# Patient Record
Sex: Female | Born: 1970 | Race: White | Hispanic: No | Marital: Single | Smoking: Current every day smoker
Health system: Southern US, Community
[De-identification: ages and names within clinical notes are randomized; demographics above are authoritative.]

## PROBLEM LIST (undated history)

## (undated) DIAGNOSIS — T7840XA Allergy, unspecified, initial encounter: Secondary | ICD-10-CM

## (undated) DIAGNOSIS — I2510 Atherosclerotic heart disease of native coronary artery without angina pectoris: Secondary | ICD-10-CM

## (undated) DIAGNOSIS — M1990 Unspecified osteoarthritis, unspecified site: Secondary | ICD-10-CM

## (undated) DIAGNOSIS — R002 Palpitations: Secondary | ICD-10-CM

## (undated) DIAGNOSIS — B009 Herpesviral infection, unspecified: Secondary | ICD-10-CM

## (undated) DIAGNOSIS — F419 Anxiety disorder, unspecified: Secondary | ICD-10-CM

## (undated) DIAGNOSIS — K219 Gastro-esophageal reflux disease without esophagitis: Secondary | ICD-10-CM

## (undated) DIAGNOSIS — E785 Hyperlipidemia, unspecified: Secondary | ICD-10-CM

## (undated) HISTORY — DX: Herpesviral infection, unspecified: B00.9

## (undated) HISTORY — DX: Anxiety disorder, unspecified: F41.9

## (undated) HISTORY — DX: Unspecified osteoarthritis, unspecified site: M19.90

## (undated) HISTORY — DX: Palpitations: R00.2

## (undated) HISTORY — DX: Gastro-esophageal reflux disease without esophagitis: K21.9

## (undated) HISTORY — DX: Hyperlipidemia, unspecified: E78.5

## (undated) HISTORY — DX: Atherosclerotic heart disease of native coronary artery without angina pectoris: I25.10

## (undated) HISTORY — DX: Allergy, unspecified, initial encounter: T78.40XA

## (undated) HISTORY — PX: OVARIAN CYST SURGERY: SHX726

---

## 1999-07-21 ENCOUNTER — Other Ambulatory Visit: Admission: RE | Admit: 1999-07-21 | Discharge: 1999-07-21 | Payer: Self-pay | Admitting: Obstetrics and Gynecology

## 2001-10-23 ENCOUNTER — Other Ambulatory Visit: Admission: RE | Admit: 2001-10-23 | Discharge: 2001-10-23 | Payer: Self-pay | Admitting: Obstetrics and Gynecology

## 2002-12-14 ENCOUNTER — Other Ambulatory Visit: Admission: RE | Admit: 2002-12-14 | Discharge: 2002-12-14 | Payer: Self-pay | Admitting: Obstetrics and Gynecology

## 2003-08-27 ENCOUNTER — Other Ambulatory Visit: Admission: RE | Admit: 2003-08-27 | Discharge: 2003-08-27 | Payer: Self-pay | Admitting: Obstetrics and Gynecology

## 2004-02-16 ENCOUNTER — Other Ambulatory Visit: Admission: RE | Admit: 2004-02-16 | Discharge: 2004-02-16 | Payer: Self-pay | Admitting: Obstetrics and Gynecology

## 2004-08-10 ENCOUNTER — Other Ambulatory Visit: Admission: RE | Admit: 2004-08-10 | Discharge: 2004-08-10 | Payer: Self-pay | Admitting: Obstetrics and Gynecology

## 2005-04-11 ENCOUNTER — Other Ambulatory Visit: Admission: RE | Admit: 2005-04-11 | Discharge: 2005-04-11 | Payer: Self-pay | Admitting: Obstetrics and Gynecology

## 2007-05-30 ENCOUNTER — Emergency Department (HOSPITAL_COMMUNITY): Admission: EM | Admit: 2007-05-30 | Discharge: 2007-05-31 | Payer: Self-pay | Admitting: Emergency Medicine

## 2007-09-23 ENCOUNTER — Encounter: Admission: RE | Admit: 2007-09-23 | Discharge: 2007-09-23 | Payer: Self-pay | Admitting: Obstetrics and Gynecology

## 2008-05-06 ENCOUNTER — Encounter: Admission: RE | Admit: 2008-05-06 | Discharge: 2008-05-06 | Payer: Self-pay | Admitting: Obstetrics and Gynecology

## 2008-06-09 ENCOUNTER — Ambulatory Visit (HOSPITAL_COMMUNITY): Admission: RE | Admit: 2008-06-09 | Discharge: 2008-06-09 | Payer: Self-pay | Admitting: Obstetrics and Gynecology

## 2009-03-28 ENCOUNTER — Emergency Department (HOSPITAL_COMMUNITY): Admission: EM | Admit: 2009-03-28 | Discharge: 2009-03-28 | Payer: Self-pay | Admitting: Emergency Medicine

## 2010-04-02 ENCOUNTER — Inpatient Hospital Stay (INDEPENDENT_AMBULATORY_CARE_PROVIDER_SITE_OTHER)
Admission: RE | Admit: 2010-04-02 | Discharge: 2010-04-02 | Disposition: A | Discharge status: Home / Self Care | Payer: PRIVATE HEALTH INSURANCE | Source: Ambulatory Visit | Attending: Family Medicine | Admitting: Family Medicine

## 2010-04-02 DIAGNOSIS — J019 Acute sinusitis, unspecified: Secondary | ICD-10-CM

## 2010-04-02 DIAGNOSIS — J309 Allergic rhinitis, unspecified: Secondary | ICD-10-CM

## 2012-05-08 ENCOUNTER — Other Ambulatory Visit: Payer: Self-pay | Admitting: Internal Medicine

## 2012-08-19 ENCOUNTER — Ambulatory Visit (INDEPENDENT_AMBULATORY_CARE_PROVIDER_SITE_OTHER): Payer: BC Managed Care – PPO | Admitting: Family Medicine

## 2012-08-19 ENCOUNTER — Ambulatory Visit: Payer: BC Managed Care – PPO

## 2012-08-19 ENCOUNTER — Encounter: Payer: Self-pay | Admitting: Family Medicine

## 2012-08-19 VITALS — BP 104/66 | HR 89 | Temp 99.5°F | Resp 16 | Ht 64.5 in | Wt 120.0 lb

## 2012-08-19 DIAGNOSIS — F39 Unspecified mood [affective] disorder: Secondary | ICD-10-CM

## 2012-08-19 DIAGNOSIS — F17200 Nicotine dependence, unspecified, uncomplicated: Secondary | ICD-10-CM

## 2012-08-19 DIAGNOSIS — F17209 Nicotine dependence, unspecified, with unspecified nicotine-induced disorders: Secondary | ICD-10-CM | POA: Insufficient documentation

## 2012-08-19 DIAGNOSIS — Z720 Tobacco use: Secondary | ICD-10-CM

## 2012-08-19 DIAGNOSIS — Z23 Encounter for immunization: Secondary | ICD-10-CM

## 2012-08-19 DIAGNOSIS — F339 Major depressive disorder, recurrent, unspecified: Secondary | ICD-10-CM | POA: Insufficient documentation

## 2012-08-19 DIAGNOSIS — Z0000 Encounter for general adult medical examination without abnormal findings: Secondary | ICD-10-CM | POA: Insufficient documentation

## 2012-08-19 LAB — LIPID PANEL
Cholesterol: 137 mg/dL (ref 0–200)
HDL: 35 mg/dL — ABNORMAL LOW (ref >39)
Total CHOL/HDL Ratio: 3.9 Ratio
Triglycerides: 98 mg/dL (ref <150)
VLDL: 20 mg/dL (ref 0–40)

## 2012-08-19 LAB — POCT URINALYSIS DIPSTICK
Bilirubin, UA: NEGATIVE
Leukocytes, UA: NEGATIVE
Nitrite, UA: NEGATIVE
Protein, UA: NEGATIVE
Urobilinogen, UA: 0.2
pH, UA: 5.5

## 2012-08-19 LAB — COMPREHENSIVE METABOLIC PANEL
BUN: 11 mg/dL (ref 6–23)
CO2: 24 mEq/L (ref 19–32)
Calcium: 9.3 mg/dL (ref 8.4–10.5)
Creat: 0.79 mg/dL (ref 0.50–1.10)
Glucose, Bld: 82 mg/dL (ref 70–99)
Total Bilirubin: 0.5 mg/dL (ref 0.3–1.2)

## 2012-08-19 NOTE — Patient Instructions (Signed)
Keeping You Healthy  Get These Tests 1. Blood Pressure- Have your blood pressure checked once a year by your health care provider.  Normal blood pressure is 120/80. 2. Weight- Have your body mass index (BMI) calculated to screen for obesity.  BMI is measure of body fat based on height and weight.  You can also calculate your own BMI at www.nhlbisupport.com/bmi/. 3. Cholesterol- Have your cholesterol checked every 5 years starting at age 44 then yearly starting at age 45. 4. Chlamydia, HIV, and other sexually transmitted diseases- Get screened every year until age 25, then within three months of each new sexual provider. 5. Pap Smear- Every 1-3 years; discuss with your health care provider. 6. Mammogram- Every year starting at age 40  Take these medicines  Calcium with Vitamin D-Your body needs 1200 mg of Calcium each day and 800-1000 IU of Vitamin D daily.  Your body can only absorb 500 mg of Calcium at a time so Calcium must be taken in 2 or 3 divided doses throughout the day.  Multivitamin with folic acid- Once daily if it is possible for you to become pregnant.  Get these Immunizations  Gardasil-Series of three doses; prevents HPV related illness such as genital warts and cervical cancer.  Menactra-Single dose; prevents meningitis.  Tetanus shot- Every 10 years.  Flu shot-Every year.  Take these steps 1. Do not smoke-Your healthcare provider can help you quit.  For tips on how to quit go to www.smokefree.gov or call 1-800 QUITNOW. 2. Be physically active- Exercise 5 days a week for at least 30 minutes.  If you are not already physically active, start slow and gradually work up to 30 minutes of moderate physical activity.  Examples of moderate activity include walking briskly, dancing, swimming, bicycling, etc. 3. Breast Cancer- A self breast exam every month is important for early detection of breast cancer.  For more information and instruction on self breast exams, ask your  healthcare provider or www.womenshealth.gov/faq/breast-self-exam.cfm. 4. Eat a healthy diet- Eat a variety of healthy foods such as fruits, vegetables, whole grains, low fat milk, low fat cheeses, yogurt, lean meats, poultry and fish, beans, nuts, tofu, etc.  For more information go to www. Thenutritionsource.org 5. Drink alcohol in moderation- Limit alcohol intake to one drink or less per day. Never drink and drive. 6. Depression- Your emotional health is as important as your physical health.  If you're feeling down or losing interest in things you normally enjoy please talk to your healthcare provider about being screened for depression. 7. Dental visit- Brush and floss your teeth twice daily; visit your dentist twice a year. 8. Eye doctor- Get an eye exam at least every 2 years. 9. Helmet use- Always wear a helmet when riding a bicycle, motorcycle, rollerblading or skateboarding. 10. Safe sex- If you may be exposed to sexually transmitted infections, use a condom. 11. Seat belts- Seat belts can save your live; always wear one. 12. Smoke/Carbon Monoxide detectors- These detectors need to be installed on the appropriate level of your home. Replace batteries at least once a year. 13. Skin cancer- When out in the sun please cover up and use sunscreen 15 SPF or higher. 14. Violence- If anyone is threatening or hurting you, please tell your healthcare provider.       Smoking Cessation Quitting smoking is important to your health and has many advantages. However, it is not always easy to quit since nicotine is a very addictive drug. Often times, people try 3 times   or more before being able to quit. This document explains the best ways for you to prepare to quit smoking. Quitting takes hard work and a lot of effort, but you can do it. ADVANTAGES OF QUITTING SMOKING  You will live longer, feel better, and live better.  Your body will feel the impact of quitting smoking almost immediately.  Within  20 minutes, blood pressure decreases. Your pulse returns to its normal level.  After 8 hours, carbon monoxide levels in the blood return to normal. Your oxygen level increases.  After 24 hours, the chance of having a heart attack starts to decrease. Your breath, hair, and body stop smelling like smoke.  After 48 hours, damaged nerve endings begin to recover. Your sense of taste and smell improve.  After 72 hours, the body is virtually free of nicotine. Your bronchial tubes relax and breathing becomes easier.  After 2 to 12 weeks, lungs can hold more air. Exercise becomes easier and circulation improves.  The risk of having a heart attack, stroke, cancer, or lung disease is greatly reduced.  After 1 year, the risk of coronary heart disease is cut in half.  After 5 years, the risk of stroke falls to the same as a nonsmoker.  After 10 years, the risk of lung cancer is cut in half and the risk of other cancers decreases significantly.  After 15 years, the risk of coronary heart disease drops, usually to the level of a nonsmoker.  If you are pregnant, quitting smoking will improve your chances of having a healthy baby.  The people you live with, especially any children, will be healthier.  You will have extra money to spend on things other than cigarettes. QUESTIONS TO THINK ABOUT BEFORE ATTEMPTING TO QUIT You may want to talk about your answers with your caregiver.  Why do you want to quit?  If you tried to quit in the past, what helped and what did not?  What will be the most difficult situations for you after you quit? How will you plan to handle them?  Who can help you through the tough times? Your family? Friends? A caregiver?  What pleasures do you get from smoking? What ways can you still get pleasure if you quit? Here are some questions to ask your caregiver:  How can you help me to be successful at quitting?  What medicine do you think would be best for me and how  should I take it?  What should I do if I need more help?  What is smoking withdrawal like? How can I get information on withdrawal? GET READY  Set a quit date.  Change your environment by getting rid of all cigarettes, ashtrays, matches, and lighters in your home, car, or work. Do not let people smoke in your home.  Review your past attempts to quit. Think about what worked and what did not. GET SUPPORT AND ENCOURAGEMENT You have a better chance of being successful if you have help. You can get support in many ways.  Tell your family, friends, and co-workers that you are going to quit and need their support. Ask them not to smoke around you.  Get individual, group, or telephone counseling and support. Programs are available at local hospitals and health centers. Call your local health department for information about programs in your area.  Spiritual beliefs and practices may help some smokers quit.  Download a "quit meter" on your computer to keep track of quit statistics, such as how   long you have gone without smoking, cigarettes not smoked, and money saved.  Get a self-help book about quitting smoking and staying off of tobacco. LEARN NEW SKILLS AND BEHAVIORS  Distract yourself from urges to smoke. Talk to someone, go for a walk, or occupy your time with a task.  Change your normal routine. Take a different route to work. Drink tea instead of coffee. Eat breakfast in a different place.  Reduce your stress. Take a hot bath, exercise, or read a book.  Plan something enjoyable to do every day. Reward yourself for not smoking.  Explore interactive web-based programs that specialize in helping you quit. GET MEDICINE AND USE IT CORRECTLY Medicines can help you stop smoking and decrease the urge to smoke. Combining medicine with the above behavioral methods and support can greatly increase your chances of successfully quitting smoking.  Nicotine replacement therapy helps deliver  nicotine to your body without the negative effects and risks of smoking. Nicotine replacement therapy includes nicotine gum, lozenges, inhalers, nasal sprays, and skin patches. Some may be available over-the-counter and others require a prescription.  Antidepressant medicine helps people abstain from smoking, but how this works is unknown. This medicine is available by prescription.  Nicotinic receptor partial agonist medicine simulates the effect of nicotine in your brain. This medicine is available by prescription. Ask your caregiver for advice about which medicines to use and how to use them based on your health history. Your caregiver will tell you what side effects to look out for if you choose to be on a medicine or therapy. Carefully read the information on the package. Do not use any other product containing nicotine while using a nicotine replacement product.  RELAPSE OR DIFFICULT SITUATIONS Most relapses occur within the first 3 months after quitting. Do not be discouraged if you start smoking again. Remember, most people try several times before finally quitting. You may have symptoms of withdrawal because your body is used to nicotine. You may crave cigarettes, be irritable, feel very hungry, cough often, get headaches, or have difficulty concentrating. The withdrawal symptoms are only temporary. They are strongest when you first quit, but they will go away within 10 14 days. To reduce the chances of relapse, try to:  Avoid drinking alcohol. Drinking lowers your chances of successfully quitting.  Reduce the amount of caffeine you consume. Once you quit smoking, the amount of caffeine in your body increases and can give you symptoms, such as a rapid heartbeat, sweating, and anxiety.  Avoid smokers because they can make you want to smoke.  Do not let weight gain distract you. Many smokers will gain weight when they quit, usually less than 10 pounds. Eat a healthy diet and stay active. You  can always lose the weight gained after you quit.  Find ways to improve your mood other than smoking. FOR MORE INFORMATION  www.smokefree.gov  Document Released: 01/16/2001 Document Revised: 07/24/2011 Document Reviewed: 05/03/2011 [employer] Patient Information 2014 [employer].  

## 2012-08-19 NOTE — Progress Notes (Signed)
Subjective:    Patient ID: Kelly Vincent, female    DOB: 07-05-1970, 42 y.o.   MRN: [id number]  HPI  This 42 y.o. Cauc female is here to establish care and have CPE (PAP w/ GYN). She has hx  of mood disorder treated w/ Depakote years ago; that medication was not well tolerated (too  sedating for pt). She has recently started Wellbutrin for smoking cessation. Other psychosocial  issues revolve around 68 y.o. Autistic son who is off all medications and living in pt's home by   himself. Son's father lives down the street from son; pt reports that she can return to the home  because of son's anger issues. She may have to pursue legal remedies to get him back into  treatment and on medications.  PMHx snd Soc Hx reviewed.  HCM:  [facility]- 2-3 years ago.            PAP/MMG per GYN.            IMM- needs Tdap.   Review of Systems  Constitutional: Negative.   HENT: Positive for neck stiffness.   Eyes: Negative.   Respiratory: Negative.   Cardiovascular: Negative.   Gastrointestinal: Negative.   Endocrine: Negative.   Genitourinary: Negative.   Musculoskeletal: Positive for arthralgias.       Right hip  Skin: Negative.   Allergic/Immunologic: Negative.   Neurological: Negative.   Hematological: Negative.   Psychiatric/Behavioral: Positive for confusion and dysphoric mood.       Confusion - per patient on new meds        Objective:   Physical Exam  Nursing note and vitals reviewed. Constitutional: She is oriented to person, place, and time. Vital signs are normal. She appears well-developed and well-nourished. No distress.  HENT:  Head: Normocephalic and atraumatic.  Right Ear: Hearing, tympanic membrane, external ear and ear canal normal.  Left Ear: Hearing, tympanic membrane, external ear and ear canal normal.  Nose: Nose normal. No nasal deformity or septal deviation.  Mouth/Throat: Uvula is midline, oropharynx is clear and moist and mucous membranes are normal. No oral  lesions. Normal dentition.  Eyes: Conjunctivae, EOM and lids are normal. Pupils are equal, round, and reactive to light. No scleral icterus.  Fundoscopic exam:      The right eye shows no arteriolar narrowing, no AV nicking and no papilledema. The right eye shows red reflex.       The left eye shows no arteriolar narrowing, no AV nicking and no papilledema. The left eye shows red reflex.  Neck: Normal range of motion. Neck supple. No thyromegaly present.  Cardiovascular: Normal rate, regular rhythm, S1 normal, S2 normal, normal heart sounds and normal pulses.  PMI is not displaced.  Exam reveals no gallop and no friction rub.   No murmur heard. Pulmonary/Chest: Effort normal and breath sounds normal. No respiratory distress. She has no wheezes.  Abdominal: Soft. Normal appearance and bowel sounds are normal. She exhibits no distension, no pulsatile midline mass and no mass. There is no hepatomegaly. There is no tenderness. There is no guarding and no CVA tenderness. A hernia is present.  Umbilicus- small barely perceptible hernia; nontender.  Genitourinary:  Deferred.  Musculoskeletal: Normal range of motion. She exhibits no edema and no tenderness.  Lymphadenopathy:    She has no cervical adenopathy.  Neurological: She is alert and oriented to person, place, and time. She has normal strength. She displays no atrophy. No cranial nerve deficit or sensory deficit.  She exhibits normal muscle tone. Coordination and gait normal.  Reflex Scores:      Bicep reflexes are 3+ on the right side and 3+ on the left side.      Patellar reflexes are 3+ on the right side and 3+ on the left side. Skin: Skin is warm, dry and intact. No rash noted. No erythema. No pallor. Nails show no clubbing.  Pt has multiple hyperpigmented and flesh-colored lesions mainly on torso.  Psychiatric: Judgment and thought content normal. Her mood appears anxious. Her affect is not labile and not inappropriate. Her speech is rapid  and/or pressured and tangential. Her speech is not slurred. She is hyperactive. She is not agitated. Cognition and memory are normal. She does not exhibit a depressed mood. She is attentive.    Results for orders placed in visit on 08/19/12  POCT URINALYSIS DIPSTICK      Result Value Range   Color, UA yellow     Clarity, UA clear     Glucose, UA neg     Bilirubin, UA neg     Ketones, UA neg     Spec Grav, UA 1.025     Blood, UA neg     pH, UA 5.5     Protein, UA neg     Urobilinogen, UA 0.2     Nitrite, UA neg     Leukocytes, UA Negative      We attempted to do chest xray (at pt's request) but equipment malfunctioned.     Assessment & Plan:  Routine general medical examination at a health care facility - Plan: POCT urinalysis dipstick, T4, free, TSH, Vitamin D 25 hydroxy, Comprehensive metabolic panel, Lipid panel, CBC with Differential  Unspecified episodic mood disorder - Pt on Bupropion less than 1 month; she will follow-up with the prescriber of that medication.Plan: T4, free, TSH  Tobacco user - Plan: Pick a QUIT DATE; continue Bupropion (this med was prescribed for smoking cessation).  Pt will return in 2 weeks for CXR.  Need for prophylactic vaccination with combined diphtheria-tetanus-pertussis (DTP) vaccine - Plan: Tdap vaccine greater than or equal to 7yo IM

## 2012-08-20 LAB — CBC WITH DIFFERENTIAL/PLATELET
Basophils Relative: 0 % (ref 0–1)
Eosinophils Absolute: 0.1 10*3/uL (ref 0.0–0.7)
Eosinophils Relative: 1 % (ref 0–5)
MCH: 31.9 pg (ref 26.0–34.0)
MCHC: 34.1 g/dL (ref 30.0–36.0)
MCV: 93.4 fL (ref 78.0–100.0)
Monocytes Relative: 5 % (ref 3–12)
Neutrophils Relative %: 67 % (ref 43–77)
Platelets: 370 10*3/uL (ref 150–400)

## 2012-08-20 LAB — VITAMIN D 25 HYDROXY (VIT D DEFICIENCY, FRACTURES): Vit D, 25-Hydroxy: 39 ng/mL (ref 30–89)

## 2012-08-20 NOTE — Progress Notes (Signed)
Quick Note:  Please contact pt and advise that the following labs are abnormal... Vitamin D level is just barely normal; get an over-the-counter Vitamin D supplement 2000 IU and take 1 tablet or capsule daily. You also need to try to get 10-15 minutes of sun exposure most days of the week to help your body use the Vitamin D. This could help your mood and overall health.  Thyroid tests, sodium, potassium, blood sugar, calcium and kidney and liver tests are all normal.  Complete blood counts show an elevated white cell count (could be stress related or due to chronic sinus or respiratory tract problems) but no other abnormalities. The different white cells are present in the normal proportions.  Your lipid panel looks good except HDL ("good") cholesterol is to low. A healthier diet with fruits, vegetables, lean meats and "good" fats as well as regular exercise will increase the HDL cholesterol.  Copy to pt. ______

## 2012-09-03 ENCOUNTER — Encounter: Payer: Self-pay | Admitting: Family Medicine

## 2012-09-03 ENCOUNTER — Ambulatory Visit: Payer: BC Managed Care – PPO

## 2012-09-03 ENCOUNTER — Encounter: Payer: BC Managed Care – PPO | Admitting: Family Medicine

## 2012-09-03 ENCOUNTER — Ambulatory Visit (INDEPENDENT_AMBULATORY_CARE_PROVIDER_SITE_OTHER): Payer: BC Managed Care – PPO | Admitting: Family Medicine

## 2012-09-03 DIAGNOSIS — F17200 Nicotine dependence, unspecified, uncomplicated: Secondary | ICD-10-CM

## 2012-09-03 NOTE — Progress Notes (Signed)
This 42 y.o. [place] female returns today only for CXR; equipment malfunction prevented xray from being performed at previous visit. Pt is a heavy smoker and had concerns about lung health. She was advised to decrease tobacco use and was scheduled to return for xray.  O: [facility] reading (PRIMARY) by  Dr. Audria Nine- CXR reveals no active lung disease; cardiac silhouette is normal and there are no areas of consolidation or lesions. Thoracic spine has mild kyphosis, vertebral spurring w/ normal disc height.  A/P: Tobacco user- smoking cessation advised.

## 2012-09-03 NOTE — Progress Notes (Signed)
Quick Note:  Your recent imaging study (chest xray) is normal. ______ 

## 2012-09-19 ENCOUNTER — Encounter: Payer: Self-pay | Admitting: Family Medicine

## 2012-11-27 ENCOUNTER — Other Ambulatory Visit (HOSPITAL_COMMUNITY): Payer: Self-pay | Admitting: Obstetrics and Gynecology

## 2012-11-27 DIAGNOSIS — N971 Female infertility of tubal origin: Secondary | ICD-10-CM

## 2012-11-27 DIAGNOSIS — Z308 Encounter for other contraceptive management: Secondary | ICD-10-CM

## 2012-12-11 ENCOUNTER — Other Ambulatory Visit: Payer: Self-pay

## 2013-02-06 ENCOUNTER — Ambulatory Visit (HOSPITAL_COMMUNITY)
Admission: RE | Admit: 2013-02-06 | Discharge: 2013-02-06 | Disposition: A | Discharge status: Home / Self Care | Payer: BC Managed Care – PPO | Source: Ambulatory Visit | Attending: Obstetrics and Gynecology | Admitting: Obstetrics and Gynecology

## 2013-02-06 DIAGNOSIS — Z308 Encounter for other contraceptive management: Secondary | ICD-10-CM

## 2013-02-06 DIAGNOSIS — N971 Female infertility of tubal origin: Secondary | ICD-10-CM

## 2013-02-06 DIAGNOSIS — Z3049 Encounter for surveillance of other contraceptives: Secondary | ICD-10-CM | POA: Insufficient documentation

## 2013-02-06 MED ORDER — IOHEXOL 300 MG/ML  SOLN
20.0000 mL | Freq: Once | INTRAMUSCULAR | Status: AC | PRN
  Administered 2013-02-06: 20 mL

## 2013-03-03 ENCOUNTER — Other Ambulatory Visit: Payer: Self-pay | Admitting: Obstetrics and Gynecology

## 2013-03-03 DIAGNOSIS — N63 Unspecified lump in unspecified breast: Secondary | ICD-10-CM

## 2013-03-12 ENCOUNTER — Other Ambulatory Visit: Payer: PRIVATE HEALTH INSURANCE

## 2013-03-13 ENCOUNTER — Ambulatory Visit
Admission: RE | Admit: 2013-03-13 | Discharge: 2013-03-13 | Disposition: A | Discharge status: Home / Self Care | Payer: BC Managed Care – PPO | Source: Ambulatory Visit | Attending: Obstetrics and Gynecology | Admitting: Obstetrics and Gynecology

## 2013-03-13 ENCOUNTER — Other Ambulatory Visit: Payer: Self-pay | Admitting: Obstetrics and Gynecology

## 2013-03-13 DIAGNOSIS — N63 Unspecified lump in unspecified breast: Secondary | ICD-10-CM

## 2013-03-23 ENCOUNTER — Other Ambulatory Visit: Payer: BC Managed Care – PPO

## 2013-03-27 ENCOUNTER — Other Ambulatory Visit: Payer: Self-pay | Admitting: Obstetrics and Gynecology

## 2013-03-27 ENCOUNTER — Ambulatory Visit
Admission: RE | Admit: 2013-03-27 | Discharge: 2013-03-27 | Disposition: A | Discharge status: Home / Self Care | Payer: BC Managed Care – PPO | Source: Ambulatory Visit | Attending: Obstetrics and Gynecology | Admitting: Obstetrics and Gynecology

## 2013-03-27 DIAGNOSIS — N63 Unspecified lump in unspecified breast: Secondary | ICD-10-CM

## 2013-07-22 ENCOUNTER — Other Ambulatory Visit: Payer: Self-pay | Admitting: Obstetrics and Gynecology

## 2013-07-22 DIAGNOSIS — N6320 Unspecified lump in the left breast, unspecified quadrant: Secondary | ICD-10-CM

## 2013-08-06 ENCOUNTER — Encounter: Payer: Self-pay | Admitting: Family Medicine

## 2013-08-06 ENCOUNTER — Ambulatory Visit (INDEPENDENT_AMBULATORY_CARE_PROVIDER_SITE_OTHER): Payer: BC Managed Care – PPO | Admitting: Family Medicine

## 2013-08-06 ENCOUNTER — Ambulatory Visit (INDEPENDENT_AMBULATORY_CARE_PROVIDER_SITE_OTHER): Payer: BC Managed Care – PPO

## 2013-08-06 VITALS — BP 90/60 | HR 75 | Temp 98.5°F | Resp 16 | Ht 65.0 in | Wt 120.6 lb

## 2013-08-06 DIAGNOSIS — F411 Generalized anxiety disorder: Secondary | ICD-10-CM

## 2013-08-06 DIAGNOSIS — F17200 Nicotine dependence, unspecified, uncomplicated: Secondary | ICD-10-CM

## 2013-08-06 MED ORDER — ESCITALOPRAM OXALATE 10 MG PO TABS: ORAL_TABLET | ORAL | Status: DC

## 2013-08-06 NOTE — Progress Notes (Signed)
Subjective:    Patient ID: Kelly Vincent, female    DOB: 03-28-1970, 43 y.o.   MRN: [id number]  HPI  Tis 43 y.o. Cauc female is here for follow-up re: chronic nicotine dependence. She started smoking at age 89 and has tried quitting multiple times. Strategies currently- not smoking in home and delaying cigarettes. She tried Wellbutrin and had crying spells daily after every dose. She is hesitant to try transdermal patch; her husband had side effects related to nicotine overdose (smoking while wearing the patch). Mood disorder has been treated w/ Depakote in remote past; pt was intolerant of this medication. She admits smoking is related to anxiety and family stress. Her son is autistic and now living on his own. She recently sold her home in [place] and has moved to [place]. She plans to vacation this month and would like to try a different medication for anxiety. She reports extreme sensitivity to medication so will want to use only the lowest dose of any med prescribed.  Patient Active Problem List   Diagnosis Date Noted  . Health care maintenance 08/19/2012  . Tobacco user 08/19/2012  . Unspecified episodic mood disorder 08/19/2012   PMHx, Surg Hx, Soc and Fam Hx reviewed.  MEDICATIONS reconciled.   Review of Systems  Constitutional: Negative for diaphoresis, activity change, appetite change, fatigue and unexpected weight change.  HENT: Negative.   Eyes: Negative.   Respiratory: Negative for cough, chest tightness, shortness of breath and wheezing.   Cardiovascular: Negative.   Gastrointestinal: Negative.   Endocrine: Negative.   Musculoskeletal: Negative.   Skin: Negative.   Neurological: Negative.   Psychiatric/Behavioral: Positive for dysphoric mood. Negative for suicidal ideas, behavioral problems, confusion, sleep disturbance, self-injury and agitation. The patient is nervous/anxious.       Objective:   Physical Exam  Nursing note and vitals  reviewed. Constitutional: She is oriented to person, place, and time. She appears well-developed and well-nourished. No distress.  HENT:  Head: Normocephalic and atraumatic.  Right Ear: External ear normal.  Left Ear: External ear normal.  Nose: Nose normal.  Mouth/Throat: Oropharynx is clear and moist.  Eyes: Conjunctivae and EOM are normal. Pupils are equal, round, and reactive to light. No scleral icterus.  Neck: Normal range of motion. Neck supple. No tracheal deviation present. No thyromegaly present.  Cardiovascular: Normal rate, regular rhythm and normal heart sounds.  Exam reveals no gallop and no friction rub.   No murmur heard. Pulmonary/Chest: Effort normal and breath sounds normal. No respiratory distress. She has no wheezes.  Musculoskeletal: Normal range of motion. She exhibits no edema and no tenderness.  Lymphadenopathy:    She has no cervical adenopathy.  Neurological: She is alert and oriented to person, place, and time. No cranial nerve deficit. She exhibits normal muscle tone. Coordination normal.  Skin: Skin is warm and dry. She is not diaphoretic. No erythema. No pallor.  Psychiatric: Her speech is normal and behavior is normal. Judgment and thought content normal. Her mood appears anxious. Her affect is not blunt, not labile and not inappropriate. Cognition and memory are normal. She does not exhibit a depressed mood.    [facility] reading (PRIMARY) by  Dr. Leward Quan: CXR- Lungs are clear w/o lesions or masses; hyperinflation. Heart size normal. 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     Assessment & Plan:  Tobacco use disorder - Encouraged current strategy. May try OTC Transdermal patch- try lowest dose patch and follow package directions. Plan: DG Chest 2  View  Anxiety state, unspecified- Trial: Lexapro  10 mg  1/2 tablet daily for at least 2 weeks; if not feeling different and having no adverse effects, then increase dose to 1 tablet daily. If feels  less anxious and thinks low dose= 5mg  is effective, continue this dose until f/u.  Meds ordered this encounter  Medications  . escitalopram (LEXAPRO) 10 MG tablet    Sig: Take 1/2- 1 tablet every morning as prescribed.    Dispense:  30 tablet    Refill:  2

## 2013-08-06 NOTE — Patient Instructions (Signed)
You Can Quit Smoking If you are ready to quit smoking or are thinking about it, congratulations! You have chosen to help yourself be healthier and live longer! There are lots of different ways to quit smoking. Nicotine gum, nicotine patches, a nicotine inhaler, or nicotine nasal spray can help with physical craving. Hypnosis, support groups, and medicines help break the habit of smoking. TIPS TO GET OFF AND STAY OFF CIGARETTES  Learn to predict your moods. Do not let a bad situation be your excuse to have a cigarette. Some situations in your life might tempt you to have a cigarette.  Ask friends and co-workers not to smoke around you.  Make your home smoke-free.  Never have "just one" cigarette. It leads to wanting another and another. Remind yourself of your decision to quit.  On a card, make a list of your reasons for not smoking. Read it at least the same number of times a day as you have a cigarette. Tell yourself everyday, "I do not want to smoke. I choose not to smoke."  Ask someone at home or work to help you with your plan to quit smoking.  Have something planned after you eat or have a cup of coffee. Take a walk or get other exercise to perk you up. This will help to keep you from overeating.  Try a relaxation exercise to calm you down and decrease your stress. Remember, you may be tense and nervous the first two weeks after you quit. This will pass.  Find new activities to keep your hands busy. Play with a pen, coin, or rubber band. Doodle or draw things on paper.  Brush your teeth right after eating. This will help cut down the craving for the taste of tobacco after meals. You can try mouthwash too.  Try gum, breath mints, or diet candy to keep something in your mouth. IF YOU SMOKE AND WANT TO QUIT:  Do not stock up on cigarettes. Never buy a carton. Wait until one pack is finished before you buy another.  Never carry cigarettes with you at work or at home.  Keep cigarettes  as far away from you as possible. Leave them with someone else.  Never carry matches or a lighter with you.  Ask yourself, "Do I need this cigarette or is this just a reflex?"  Bet with someone that you can quit. Put cigarette money in a piggy bank every morning. If you smoke, you give up the money. If you do not smoke, by the end of the week, you keep the money.  Keep trying. It takes 21 days to change a habit!  Talk to your doctor about using medicines to help you quit. These include nicotine replacement gum, lozenges, or skin patches. Document Released: 11/18/2008 Document Revised: 04/16/2011 Document Reviewed: 11/18/2008 [facility] Patient Information 2015 [place], [place]. This information is not intended to replace advice given to you by your health care provider. Make sure you discuss any questions you have with your health care provider.    I have prescribed generic LEXAPRO (escitalopram) to help reduce your anxiety and help you quit smoking. Take 1/2 tablet every morning for 2 weeks. If you do not feel any different, then increase dose to 1 tablet every morning. You may want to try a low dose patch along with this medication. The patch is available without a prescription. Follow the package direction and pick a quit date for your last day as a SMOKER.

## 2013-09-10 ENCOUNTER — Encounter: Payer: Self-pay | Admitting: Family Medicine

## 2013-09-10 ENCOUNTER — Ambulatory Visit
Admission: RE | Admit: 2013-09-10 | Discharge: 2013-09-10 | Disposition: A | Discharge status: Home / Self Care | Payer: BC Managed Care – PPO | Source: Ambulatory Visit | Attending: Obstetrics and Gynecology | Admitting: Obstetrics and Gynecology

## 2013-09-10 ENCOUNTER — Ambulatory Visit (INDEPENDENT_AMBULATORY_CARE_PROVIDER_SITE_OTHER): Payer: BC Managed Care – PPO | Admitting: Family Medicine

## 2013-09-10 VITALS — BP 90/60 | HR 74 | Temp 99.2°F | Resp 16 | Ht 64.75 in | Wt 117.6 lb

## 2013-09-10 DIAGNOSIS — N6320 Unspecified lump in the left breast, unspecified quadrant: Secondary | ICD-10-CM

## 2013-09-10 DIAGNOSIS — F39 Unspecified mood [affective] disorder: Secondary | ICD-10-CM

## 2013-09-10 DIAGNOSIS — Z720 Tobacco use: Secondary | ICD-10-CM

## 2013-09-10 DIAGNOSIS — Z0000 Encounter for general adult medical examination without abnormal findings: Secondary | ICD-10-CM

## 2013-09-10 DIAGNOSIS — F17200 Nicotine dependence, unspecified, uncomplicated: Secondary | ICD-10-CM

## 2013-09-10 DIAGNOSIS — E786 Lipoprotein deficiency: Secondary | ICD-10-CM

## 2013-09-10 LAB — CBC WITH DIFFERENTIAL/PLATELET
Basophils Absolute: 0 10*3/uL (ref 0.0–0.1)
Basophils Relative: 0 % (ref 0–1)
Eosinophils Absolute: 0 10*3/uL (ref 0.0–0.7)
Eosinophils Relative: 0 % (ref 0–5)
HEMATOCRIT: 41.1 % (ref 36.0–46.0)
HEMOGLOBIN: 13.7 g/dL (ref 12.0–15.0)
LYMPHS PCT: 31 % (ref 12–46)
Lymphs Abs: 2.6 10*3/uL (ref 0.7–4.0)
MCH: 31.9 pg (ref 26.0–34.0)
MCHC: 33.3 g/dL (ref 30.0–36.0)
MCV: 95.6 fL (ref 78.0–100.0)
MONO ABS: 0.5 10*3/uL (ref 0.1–1.0)
Monocytes Relative: 6 % (ref 3–12)
Neutro Abs: 5.2 10*3/uL (ref 1.7–7.7)
Neutrophils Relative %: 63 % (ref 43–77)
Platelets: 376 10*3/uL (ref 150–400)
RBC: 4.3 MIL/uL (ref 3.87–5.11)
RDW: 13.9 % (ref 11.5–15.5)
WBC: 8.3 10*3/uL (ref 4.0–10.5)

## 2013-09-10 NOTE — Patient Instructions (Addendum)
Keeping You Healthy  Get These Tests 1. Blood Pressure- Have your blood pressure checked once a year by your health care provider.  Normal blood pressure is 120/80. 2. Weight- Have your body mass index (BMI) calculated to screen for obesity.  BMI is measure of body fat based on height and weight.  You can also calculate your own BMI at GravelBags.it. 3. Cholesterol- Have your cholesterol checked every 5 years starting at age 45 then yearly starting at age 81. 74. Chlamydia, HIV, and other sexually transmitted diseases- Get screened every year until age 64, then within three months of each new sexual provider. 5. Pap Smear- Every 1-3 years; discuss with your health care provider. 6. Mammogram- Every year starting at age 72  Take these medicines  Calcium with Vitamin D-Your body needs 1200 mg of Calcium each day and [phone] IU of Vitamin D daily.  Your body can only absorb 500 mg of Calcium at a time so Calcium must be taken in 2 or 3 divided doses throughout the day.  Multivitamin with folic acid- Once daily if it is possible for you to become pregnant.  Get these Immunizations  Gardasil-Series of three doses; prevents HPV related illness such as genital warts and cervical cancer.  Menactra-Single dose; prevents meningitis.  Tetanus shot- Every 10 years.  Flu shot-Every year.  Take these steps 1. Do not smoke-Your healthcare provider can help you quit.  For tips on how to quit go to www.smokefree.gov or call 1-800 QUITNOW. 2. Be physically active- Exercise 5 days a week for at least 30 minutes.  If you are not already physically active, start slow and gradually work up to 30 minutes of moderate physical activity.  Examples of moderate activity include walking briskly, dancing, swimming, bicycling, etc. 3. Breast Cancer- A self breast exam every month is important for early detection of breast cancer.  For more information and instruction on self breast exams, ask your  healthcare provider or https://www.patel.info/. 4. Eat a healthy diet- Eat a variety of healthy foods such as fruits, vegetables, whole grains, low fat milk, low fat cheeses, yogurt, lean meats, poultry and fish, beans, nuts, tofu, etc.  For more information go to www. Thenutritionsource.org 5. Drink alcohol in moderation- Limit alcohol intake to one drink or less per day. Never drink and drive. 6. Depression- Your emotional health is as important as your physical health.  If you're feeling down or losing interest in things you normally enjoy please talk to your healthcare provider about being screened for depression. 7. Dental visit- Brush and floss your teeth twice daily; visit your dentist twice a year. 8. Eye doctor- Get an eye exam at least every 2 years. 9. Helmet use- Always wear a helmet when riding a bicycle, motorcycle, rollerblading or skateboarding. 37. Safe sex- If you may be exposed to sexually transmitted infections, use a condom. 11. Seat belts- Seat belts can save your live; always wear one. 12. Smoke/Carbon Monoxide detectors- These detectors need to be installed on the appropriate level of your home. Replace batteries at least once a year. 13. Skin cancer- When out in the sun please cover up and use sunscreen 15 SPF or higher. 14. Violence- If anyone is threatening or hurting you, please tell your healthcare provider.        Mediterranean Diet  Why follow it? Research shows.   Those who follow the Mediterranean diet have a reduced risk of heart disease    The diet is associated with a reduced  incidence of Parkinson's and Alzheimer's diseases   People following the diet may have longer life expectancies and lower rates of chronic diseases    The Dietary Guidelines for Americans recommends the Mediterranean diet as an eating plan to promote health and prevent disease  What Is the Mediterranean Diet?    Healthy eating plan based on typical foods and  recipes of Mediterranean-style cooking   The diet is primarily a plant based diet; these foods should make up a majority of meals   Starches - Plant based foods should make up a majority of meals - They are an important sources of vitamins, minerals, energy, antioxidants, and fiber - Choose whole grains, foods high in fiber and minimally processed items  - Typical grain sources include wheat, oats, barley, corn, brown rice, bulgar, farro, millet, polenta, couscous  - Various types of beans include chickpeas, lentils, fava beans, black beans, white beans   Fruits  Veggies - Large quantities of antioxidant rich fruits & veggies; 6 or more servings  - Vegetables can be eaten raw or lightly drizzled with oil and cooked  - Vegetables common to the traditional Mediterranean Diet include: artichokes, arugula, beets, broccoli, brussel sprouts, cabbage, carrots, celery, collard greens, cucumbers, eggplant, kale, leeks, lemons, lettuce, mushrooms, okra, onions, peas, peppers, potatoes, pumpkin, radishes, rutabaga, shallots, spinach, sweet potatoes, turnips, zucchini - Fruits common to the Mediterranean Diet include: apples, apricots, avocados, cherries, clementines, dates, figs, grapefruits, grapes, melons, nectarines, oranges, peaches, pears, pomegranates, strawberries, tangerines  Fats - Replace butter and margarine with healthy oils, such as olive oil, canola oil, and tahini  - Limit nuts to no more than a handful a day  - Nuts include walnuts, almonds, pecans, pistachios, pine nuts  - Limit or avoid candied, honey roasted or heavily salted nuts - Olives are central to the [employer] - can be eaten whole or used in a variety of dishes   Meats Protein - Limiting red meat: no more than a few times a month - When eating red meat: choose lean cuts and keep the portion to the size of deck of cards - Eggs: approx. 0 to 4 times a week  - Fish and lean poultry: at least 2 a week  - Healthy protein  sources include, chicken, [place], lean beef, lamb - Increase intake of seafood such as tuna, salmon, trout, mackerel, shrimp, scallops - Avoid or limit high fat processed meats such as sausage and bacon  Dairy - Include moderate amounts of low fat dairy products  - Focus on healthy dairy such as fat free yogurt, skim milk, low or reduced fat cheese - Limit dairy products higher in fat such as whole or 2% milk, cheese, ice cream  Alcohol - Moderate amounts of red wine is ok  - No more than 5 oz daily for women (all ages) and men older than age 28  - No more than 10 oz of wine daily for men younger than 39  Other - Limit sweets and other desserts  - Use herbs and spices instead of salt to flavor foods  - Herbs and spices common to the traditional Mediterranean Diet include: basil, bay leaves, chives, cloves, cumin, fennel, garlic, lavender, marjoram, mint, oregano, parsley, pepper, rosemary, sage, savory, sumac, tarragon, thyme   It's not just a diet, it's a lifestyle:    The Mediterranean diet includes lifestyle factors typical of those in the region    Foods, drinks and meals are best eaten with others and savored  Daily physical activity is important for overall good health   This could be strenuous exercise like running and aerobics   This could also be more leisurely activities such as walking, housework, yard-work, or taking the stairs   Moderation is the key; a balanced and healthy diet accommodates most foods and drinks   Consider portion sizes and frequency of consumption of certain foods   Meal Ideas & Options:    Breakfast:  o Whole wheat toast or whole wheat English muffins with peanut butter & hard boiled egg o Steel cut oats topped with apples & cinnamon and skim milk  o Fresh fruit: banana, strawberries, melon, berries, peaches  o Smoothies: strawberries, bananas, greek yogurt, peanut butter o Low fat greek yogurt with blueberries and granola  o Egg white omelet with  spinach and mushrooms o Breakfast couscous: whole wheat couscous, apricots, skim milk, cranberries    Sandwiches:  o Hummus and grilled vegetables (peppers, zucchini, squash) on whole wheat bread   o Grilled chicken on whole wheat pita with lettuce, tomatoes, cucumbers or tzatziki  o Tuna salad on whole wheat bread: tuna salad made with greek yogurt, olives, red peppers, capers, green onions o Garlic rosemary lamb pita: lamb sauted with garlic, rosemary, salt & pepper; add lettuce, cucumber, greek yogurt to pita - flavor with lemon juice and black pepper    Seafood:  o Mediterranean grilled salmon, seasoned with garlic, basil, parsley, lemon juice and black pepper o Shrimp, lemon, and spinach whole-grain pasta salad made with low fat greek yogurt  o Seared scallops with lemon orzo  o Seared tuna steaks seasoned salt, pepper, coriander topped with tomato mixture of olives, tomatoes, olive oil, minced garlic, parsley, green onions and cappers    Meats:  o Herbed greek chicken salad with kalamata olives, cucumber, feta  o Red bell peppers stuffed with spinach, bulgur, lean ground beef (or lentils) & topped with feta   o Kebabs: skewers of chicken, tomatoes, onions, zucchini, squash  o [place] burgers: made with red onions, mint, dill, lemon juice, feta cheese topped with roasted red peppers   Vegetarian o Cucumber salad: cucumbers, artichoke hearts, celery, red onion, feta cheese, tossed in olive oil & lemon juice  o Hummus and whole grain pita points with a greek salad (lettuce, tomato, feta, olives, cucumbers, red onion) o Lentil soup with celery, carrots made with vegetable broth, garlic, salt and pepper  o Tabouli salad: parsley, bulgur, mint, scallions, cucumbers, tomato, radishes, lemon juice, olive oil, salt and pepper. o    LEXAPRO medication- Take this medication at bedtime since it is making you drowsy. Continue to take 1/2 tablet for now. 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 It may take you several days to adjust to the increased dose.  Have your pharmacy contact the clinic when you need medication refill.    I encourage you to stop smoking on your birthday (11/06/2015).  See the tips below for suggestions:    You Can Quit Smoking If you are ready to quit smoking or are thinking about it, congratulations! You have chosen to help yourself be healthier and live longer! There are lots of different ways to quit smoking. Nicotine gum, nicotine patches, a nicotine inhaler, or nicotine nasal spray can help with physical craving. Hypnosis, support groups, and medicines help break the habit of smoking. TIPS TO GET OFF AND STAY OFF CIGARETTES  Learn to  predict your moods. Do not let a bad situation be your excuse to have a cigarette. Some situations in your life might tempt you to have a cigarette.  Ask friends and co-workers not to smoke around you.  Make your home smoke-free.  Never have "just one" cigarette. It leads to wanting another and another. Remind yourself of your decision to quit.  On a card, make a list of your reasons for not smoking. Read it at least the same number of times a day as you have a cigarette. Tell yourself everyday, "I do not want to smoke. I choose not to smoke."  Ask someone at home or work to help you with your plan to quit smoking.  Have something planned after you eat or have a cup of coffee. Take a walk or get other exercise to perk you up. This will help to keep you from overeating.  Try a relaxation exercise to calm you down and decrease your stress. Remember, you may be tense and nervous the first two weeks after you quit. This will pass.  Find new activities to keep your hands busy. Play with a pen, coin, or rubber band. Doodle or draw things on paper.  Brush your teeth right after eating. This will help cut down the craving for the taste of tobacco after meals. You can  try mouthwash too.  Try gum, breath mints, or diet candy to keep something in your mouth. IF YOU SMOKE AND WANT TO QUIT:  Do not stock up on cigarettes. Never buy a carton. Wait until one pack is finished before you buy another.  Never carry cigarettes with you at work or at home.  Keep cigarettes as far away from you as possible. Leave them with someone else.  Never carry matches or a lighter with you.  Ask yourself, "Do I need this cigarette or is this just a reflex?"  Bet with someone that you can quit. Put cigarette money in a piggy bank every morning. If you smoke, you give up the money. If you do not smoke, by the end of the week, you keep the money.  Keep trying. It takes 21 days to change a habit!  Talk to your doctor about using medicines to help you quit. These include nicotine replacement gum, lozenges, or skin patches. Document Released: 11/18/2008 Document Revised: 04/16/2011 Document Reviewed: 11/18/2008 [facility] Patient Information 2015 [place], [place]. This information is not intended to replace advice given to you by your health care provider. Make sure you discuss any questions you have with your health care provider.

## 2013-09-10 NOTE — Progress Notes (Signed)
Subjective:    Patient ID: Kelly Vincent, female    DOB: 06/01/1970, 43 y.o.   MRN: [id number]  HPI  This 43 y.o. Cauc female is here for CPE. Breast/GYN exam performed by Dr. Willis Modena. No abnormalities found. Pt is taking Lexapro (generic) prescribed for mood disorder. Current dose is 1/2 tablet taken every morning; pt c/o drowsiness and midday sleepiness. Medication is effective; she is less talkative, mood is more stable and less irritable. She plans to increase dose to 1 tablet this weekend. Pt is smoking less since starting medication.  HCM: MMG- Current.           PAP- Current as noted above.           IMM- Tdap current.           Dental- Current (3 cavities filled at last visit).           Vision- Needs to schedule.  Patient Active Problem List   Diagnosis Date Noted  . Health care maintenance 08/19/2012  . Tobacco user 08/19/2012  . Unspecified episodic mood disorder 08/19/2012    Outpatient Encounter Prescriptions as of 09/10/2013  Medication Sig Note  . escitalopram (LEXAPRO) 10 MG tablet Take 1/2- 1 tablet every morning as prescribed. 09/10/2013: Need refill    Allergies  Allergen Reactions  . Codeine Nausea And Vomiting  . Sulfa Antibiotics Hives and Rash    Past Surgical History  Procedure Laterality Date  . Ovarian cyst surgery      rupture of cyst    History   Social History  . Marital Status: Single    Spouse Name: N/A    Number of Children: N/A  . Years of Education: N/A   Occupational History  . special education teacher    Social History Main Topics  . Smoking status: Current Every Day Smoker -- 1.00 packs/day for 30 years    Types: Cigarettes  . Smokeless tobacco: Not on file  . Alcohol Use: No     Comment: LIQUOR 1-2 drinks  . Drug Use: No  . Sexual Activity: Not on file   Other Topics Concern  . Not on file   Social History Narrative   Significant other   College education   Works as a Chief Technology Officer   exercises   Pt  is adopted.   Review of Systems  Constitutional: Negative.   HENT: Positive for ear pain.   Eyes: Negative.   Respiratory: Negative.   Cardiovascular: Negative.   Gastrointestinal: Negative.   Endocrine: Negative.   Genitourinary: Negative.   Musculoskeletal: Positive for neck pain.  Skin: Negative.   Allergic/Immunologic: Positive for environmental allergies.  Neurological: Negative.   Hematological: Negative.   Psychiatric/Behavioral: The patient is nervous/anxious.       Objective:   Physical Exam  Nursing note and vitals reviewed. Constitutional: She is oriented to person, place, and time. Vital signs are normal. She appears well-developed and well-nourished. No distress.  HENT:  Head: Normocephalic and atraumatic.  Right Ear: Hearing, external ear and ear canal normal. Tympanic membrane is not erythematous and not retracted. No middle ear effusion.  Left Ear: Hearing, tympanic membrane, external ear and ear canal normal.  Nose: Nose normal. No nasal deformity or septal deviation.  Mouth/Throat: Uvula is midline, oropharynx is clear and moist and mucous membranes are normal. No oral lesions. Normal dentition. No dental caries.  Eyes: Conjunctivae, EOM and lids are normal. Pupils are equal, round, and reactive to light. No  scleral icterus.  Fundoscopic exam:      The right eye shows no arteriolar narrowing, no AV nicking and no papilledema. The right eye shows red reflex.       The left eye shows no arteriolar narrowing, no AV nicking and no papilledema. The left eye shows red reflex.  Neck: Trachea normal, normal range of motion and full passive range of motion without pain. Neck supple. No spinous process tenderness and no muscular tenderness present. No mass and no thyromegaly present.  Cardiovascular: Normal rate, regular rhythm, S1 normal, S2 normal, normal heart sounds, intact distal pulses and normal pulses.   No extrasystoles are present. PMI is not displaced.  Exam  reveals no gallop and no friction rub.   No murmur heard. Pulmonary/Chest: Effort normal and breath sounds normal. No respiratory distress.  Abdominal: Soft. Normal appearance and bowel sounds are normal. She exhibits no distension, no pulsatile midline mass and no mass. There is no hepatosplenomegaly. There is no tenderness. There is no guarding and no CVA tenderness.  Genitourinary:  Deferred.  Musculoskeletal:       Cervical back: Normal.       Thoracic back: Normal.       Lumbar back: Normal.  Remainder of exam unremarkable.  Lymphadenopathy:       Head (right side): No submental, no submandibular, no tonsillar, no preauricular, no posterior auricular and no occipital adenopathy present.       Head (left side): No submental, no submandibular, no tonsillar, no preauricular, no posterior auricular and no occipital adenopathy present.    She has no cervical adenopathy.       Right: No inguinal and no supraclavicular adenopathy present.       Left: No inguinal and no supraclavicular adenopathy present.  Neurological: She is alert and oriented to person, place, and time. She has normal strength. She displays no atrophy and no tremor. No cranial nerve deficit or sensory deficit. She exhibits normal muscle tone. Coordination and gait normal.  Reflex Scores:      Tricep reflexes are 2+ on the right side and 2+ on the left side.      Bicep reflexes are 2+ on the right side and 2+ on the left side.      Brachioradialis reflexes are 2+ on the right side and 2+ on the left side.      Patellar reflexes are 2+ on the right side and 2+ on the left side. Skin: Skin is warm, dry and intact. No ecchymosis, no lesion, no petechiae and no rash noted. She is not diaphoretic. No cyanosis or erythema. No pallor.  Psychiatric: Her behavior is normal. Judgment and thought content normal. Her mood appears anxious. Her affect is not labile and not inappropriate. Her speech is rapid and/or pressured. Her speech is  not tangential. Cognition and memory are normal. She does not exhibit a depressed mood.       Assessment & Plan:  Routine general medical examination at a health care facility - Plan: CBC with Differential  Low HDL (under 40) - Mediterranean Diet.    Plan: Lipid panel  Tobacco user- Encouraged cessation by next birthday (Nov 06, 2015); pt will try to continue to  limit use.  Unspecified episodic mood disorder - Stable on low-dose escitalopram 10 mg - currently taking 1/2 tablet daily. Somnolence side effect >> pt to take medication at bedtime. Stay on 1/2 tablet for now. 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 Plan: Basic metabolic panel

## 2013-09-11 LAB — BASIC METABOLIC PANEL
BUN: 13 mg/dL (ref 6–23)
CALCIUM: 9 mg/dL (ref 8.4–10.5)
CO2: 24 meq/L (ref 19–32)
CREATININE: 0.76 mg/dL (ref 0.50–1.10)
Chloride: 105 mEq/L (ref 96–112)
GLUCOSE: 91 mg/dL (ref 70–99)
Potassium: 4.2 mEq/L (ref 3.5–5.3)
Sodium: 138 mEq/L (ref 135–145)

## 2013-09-11 LAB — LIPID PANEL
Cholesterol: 130 mg/dL (ref 0–200)
HDL: 39 mg/dL — AB (ref >39)
LDL CALC: 79 mg/dL (ref 0–99)
TRIGLYCERIDES: 60 mg/dL (ref <150)
Total CHOL/HDL Ratio: 3.3 Ratio
VLDL: 12 mg/dL (ref 0–40)

## 2014-04-30 ENCOUNTER — Ambulatory Visit: Payer: BC Managed Care – PPO | Admitting: Family Medicine

## 2014-04-30 IMAGING — MG MM DIGITAL DIAGNOSTIC BILAT
5 series · 5 of 5 positions shown · non-contrast
Comparison: Multiple priors

CLINICAL DATA: Patient with left breast lump. She states that she
has recently started Depo-Provera.

EXAM:
DIGITAL DIAGNOSTIC  BILATERAL MAMMOGRAM WITH CAD
ULTRASOUND LEFT BREAST

[L TAN]
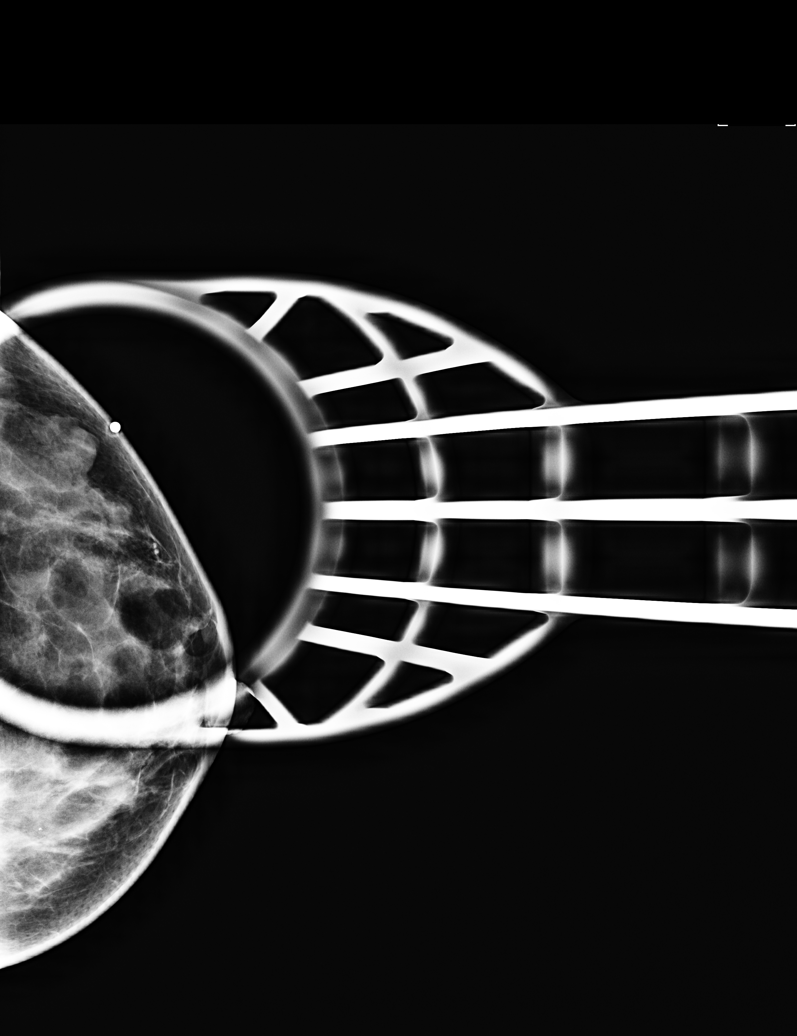

[R CC]
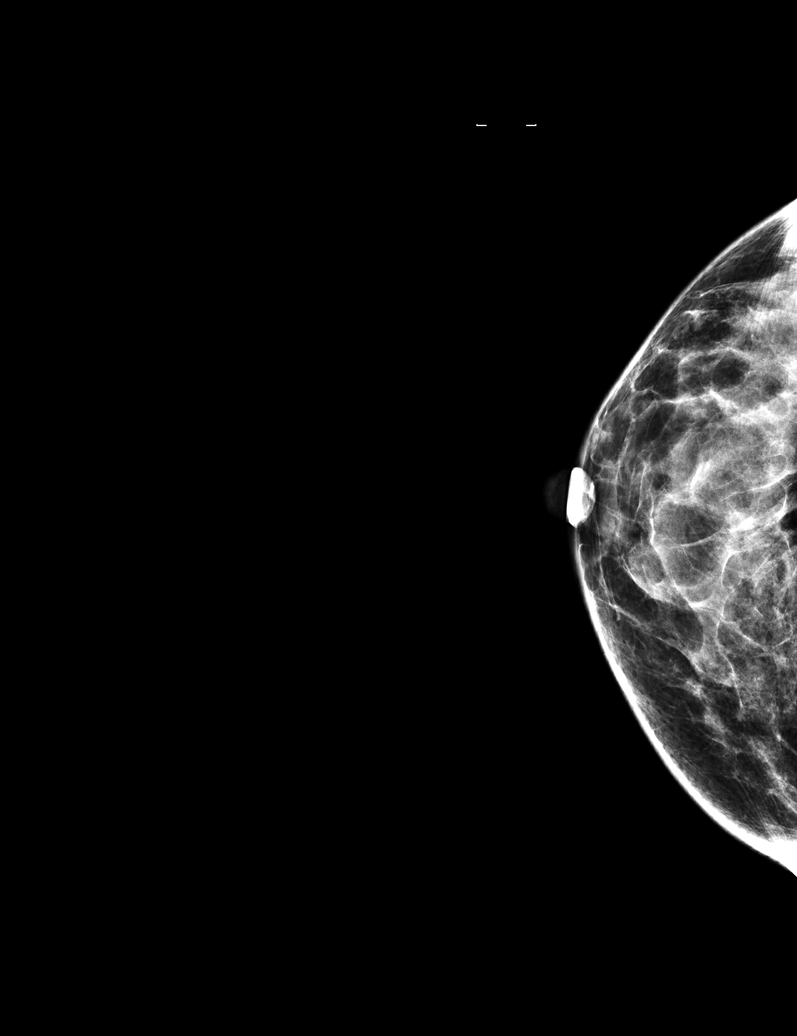

[L MLO]
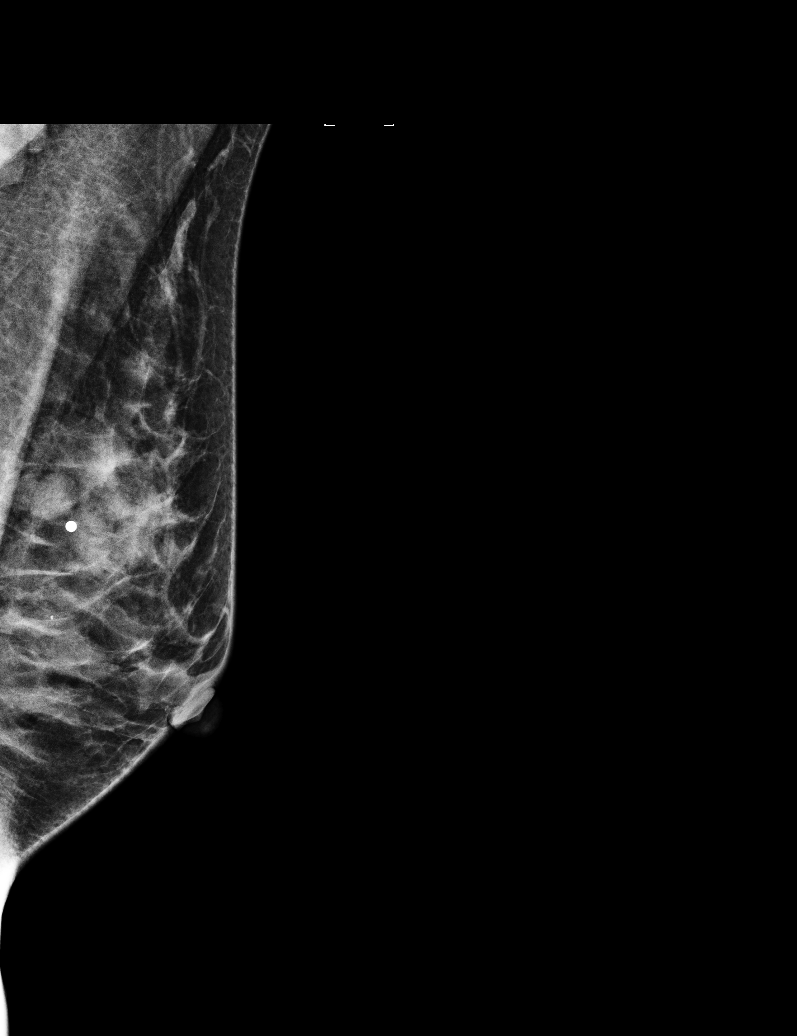

[R MLO]
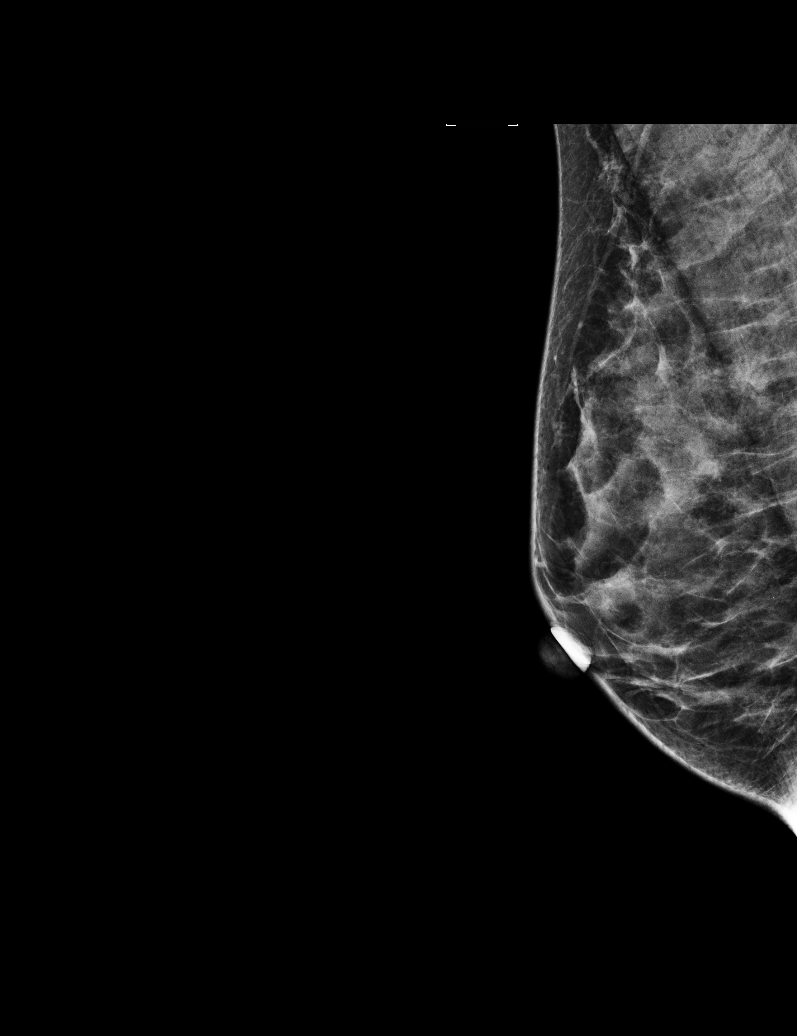

[L CC]
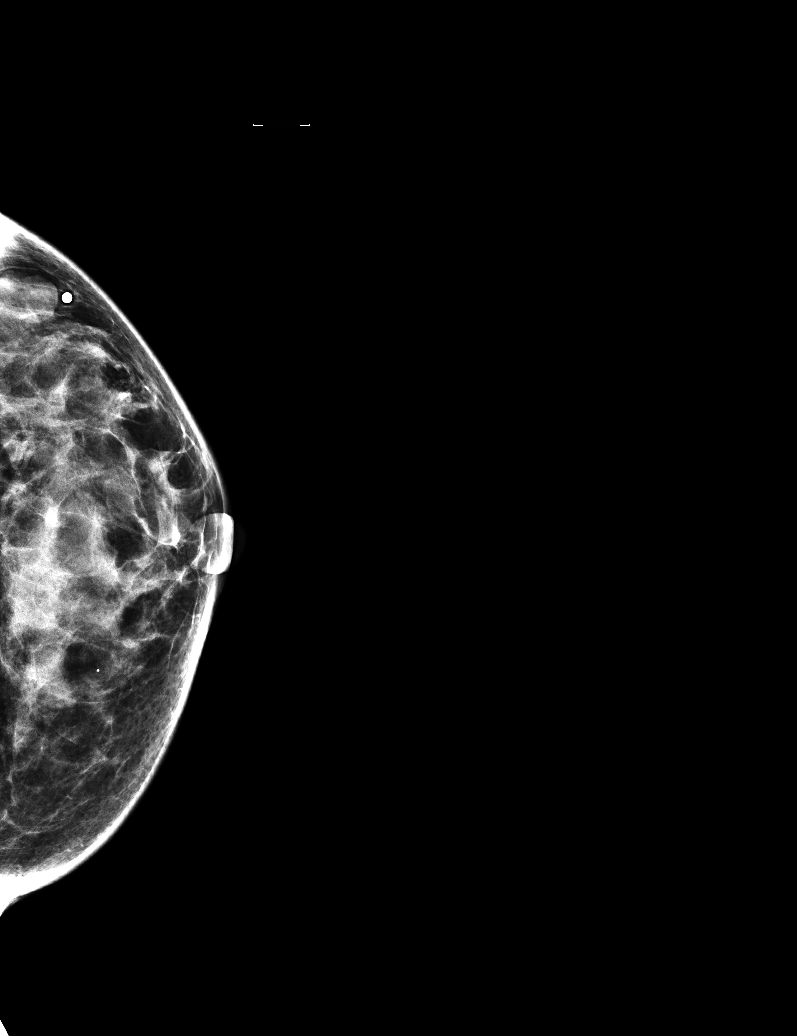

[5 of 5 positions shown; findings below may reference images not displayed]

ACR Breast Density Category c: The breast tissue is heterogeneously
dense, which may obscure small masses.
FINDINGS: In the central outer posterior left breast, there is a partially
obscured isodense mass measuring up to approximately 1 cm. No
associated microcalcifications or architectural distortion.

Mammographic images were processed with CAD.

On physical exam, a mobile mass is palpated at 3 o'clock, 5 cm from
the nipple.

Targeted ultrasound demonstrates a circumscribed hypoechoic mass at
3 o'clock, 5 cm from the nipple. This measures 1.5 x 1 x 0.7 cm. No
internal vascularity is identified. Adjacent to this at 3 o'clock, 3
cm from the nipple, is a circumscribed hypoechoic mass measuring 9 x
5 x 3 mm. This may represent a fibroadenoma. Additionally, there are
multiple subcentimeter cysts at 3 o'clock.
IMPRESSION: 1. Palpable left breast mass at 3 o'clock, 5 cm from the nipple.
2. Circumscribed 0.9 cm left breast mass at 3 o'clock, 3 cm from the
nipple.

RECOMMENDATION:
Ultrasound-guided left breast biopsy is recommended for the palpable
left breast mass at 3 o'clock, 5 cm from the nipple. If benign,
followup of the second mass can be considered. The patient will call
back to make an appointment for biopsy after reviewing her schedule.

I have discussed the findings and recommendations with the patient.
Results were also provided in writing at the conclusion of the
visit.

BI-RADS CATEGORY  4: Suspicious abnormality - biopsy should be
considered.

## 2014-05-03 ENCOUNTER — Encounter: Payer: Self-pay | Admitting: Physician Assistant

## 2014-05-03 ENCOUNTER — Ambulatory Visit (INDEPENDENT_AMBULATORY_CARE_PROVIDER_SITE_OTHER): Payer: BC Managed Care – PPO | Admitting: Physician Assistant

## 2014-05-03 VITALS — BP 99/64 | HR 87 | Temp 98.1°F | Resp 16 | Ht 64.75 in | Wt 120.2 lb

## 2014-05-03 DIAGNOSIS — Z720 Tobacco use: Secondary | ICD-10-CM

## 2014-05-03 DIAGNOSIS — F39 Unspecified mood [affective] disorder: Secondary | ICD-10-CM

## 2014-05-03 DIAGNOSIS — F17200 Nicotine dependence, unspecified, uncomplicated: Secondary | ICD-10-CM

## 2014-05-03 NOTE — Progress Notes (Signed)
   Subjective:    Patient ID: Kelly Vincent, female    DOB: 04/11/1970, 44 y.o.   MRN: [id number]  Chief Complaint  Patient presents with  . Medication Refill    stopped taking lexapro   Patient Active Problem List   Diagnosis Date Noted  . Tobacco user 08/19/2012  . Unspecified episodic mood disorder 08/19/2012   Prior to Admission medications   Medication Sig Start Date End Date Taking? Authorizing Provider  cetirizine (ZYRTEC) 10 MG tablet Take 10 mg by mouth daily.   Yes Historical Provider, MD  escitalopram (LEXAPRO) 10 MG tablet Take 1/2- 1 tablet every morning as prescribed. Patient not taking: Reported on 05/03/2014 08/06/13   Barton Fanny, MD   Medications, allergies, past medical history, surgical history, family history, social history and problem list reviewed and updated.  HPI  11 yof with pmh episodic mood disorder presents to discuss medication.   Hx: Has occasional anxiety/stress. Has autistic son who was causing her lot of stress for several yrs. Has been tried on depakote and wellbutrin in the past. Was first seen by [name] 7/15, started on lexapro 5 mg qd. Seen one month later 8/15, change to night time dosing due to somnolence.   Today pt states she stopped taking the lexapro 4 months ago. States that she has never had persistent anxiety or depression. More so episodic episodes where she gets very anxious or stressed with certain things in her life. She feels these episodes have decreased since her son moved into his own apt. High functioning autism. Sees him approx once week.   Denies anxeity most days of week, denies it affecting day to day life. Stopped lexapro as she didn't think needed it anymore and was also having decreased libido with med.   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   Not interested in benzos for episodic mood disorder. States she knows she has addictive nature.   Continues to smoke. Trying to quit cold [place]. Has tried chantix and  wellbutrin in past. Not interested in NRT.   Denies SI/HI.   Review of Systems See HPI.     Objective:   Physical Exam  Constitutional: She is oriented to person, place, and time. She appears well-developed and well-nourished.  Non-toxic appearance. She does not have a sickly appearance. She does not appear ill. No distress.  BP 99/64 mmHg  Pulse 87  Temp(Src) 98.1 F (36.7 C) (Oral)  Resp 16  Ht 5' 4.75" (1.645 m)  Wt 120 lb 3.2 oz (54.522 kg)  BMI 20.15 kg/m2  SpO2 99%  LMP 04/30/2014   Neurological: She is alert and oriented to person, place, and time.  Psychiatric: She has a normal mood and affect. Her speech is normal and behavior is normal.  2/6 positives on anxiety screen      Assessment & Plan:   19 yof with pmh episodic mood disorder presents to discuss medication.  Episodic mood disorder --pt tapered off lexapro on her own --feels happy where she currently is, with son taken care of, work going well, engaged to [employer], yoga weekly --not interested in any medication at this time --rtc 6 months for cpe  Smoker --not interested in medication to help with cessation, planning to stop cold [place] --encouraged  Julieta Gutting, PA-C Physician Assistant-Certified [facility]  05/03/2014 3:22 PM

## 2014-05-03 NOTE — Patient Instructions (Signed)
Thank you for coming in today. I think you've made the right choice with stopping the Lexapro.  Please let us know if we can help you at all with stopping smoking. Your lungs sounded good today, and your vital signs were all normal.  We'll see you back in 6 months for a complete physical.

## 2014-08-16 ENCOUNTER — Other Ambulatory Visit: Payer: Self-pay | Admitting: Obstetrics and Gynecology

## 2014-08-16 DIAGNOSIS — D242 Benign neoplasm of left breast: Secondary | ICD-10-CM

## 2014-08-19 ENCOUNTER — Other Ambulatory Visit: Payer: Self-pay | Admitting: Obstetrics and Gynecology

## 2014-08-19 ENCOUNTER — Ambulatory Visit
Admission: RE | Admit: 2014-08-19 | Discharge: 2014-08-19 | Disposition: A | Discharge status: Home / Self Care | Payer: BC Managed Care – PPO | Source: Ambulatory Visit | Attending: Obstetrics and Gynecology | Admitting: Obstetrics and Gynecology

## 2014-08-19 DIAGNOSIS — D242 Benign neoplasm of left breast: Secondary | ICD-10-CM

## 2015-02-02 ENCOUNTER — Ambulatory Visit
Admission: RE | Admit: 2015-02-02 | Discharge: 2015-02-02 | Disposition: A | Discharge status: Home / Self Care | Payer: BC Managed Care – PPO | Source: Ambulatory Visit | Attending: Obstetrics and Gynecology | Admitting: Obstetrics and Gynecology

## 2015-02-02 DIAGNOSIS — D242 Benign neoplasm of left breast: Secondary | ICD-10-CM

## 2015-11-21 ENCOUNTER — Emergency Department: Payer: BC Managed Care – PPO

## 2015-11-21 ENCOUNTER — Encounter: Payer: Self-pay | Admitting: Emergency Medicine

## 2015-11-21 DIAGNOSIS — R0789 Other chest pain: Secondary | ICD-10-CM | POA: Diagnosis not present

## 2015-11-21 DIAGNOSIS — R002 Palpitations: Secondary | ICD-10-CM | POA: Insufficient documentation

## 2015-11-21 DIAGNOSIS — F17210 Nicotine dependence, cigarettes, uncomplicated: Secondary | ICD-10-CM | POA: Insufficient documentation

## 2015-11-21 LAB — BASIC METABOLIC PANEL
ANION GAP: 7 (ref 5–15)
BUN: 15 mg/dL (ref 6–20)
CHLORIDE: 106 mmol/L (ref 101–111)
CO2: 27 mmol/L (ref 22–32)
Calcium: 9.2 mg/dL (ref 8.9–10.3)
Creatinine, Ser: 0.92 mg/dL (ref 0.44–1.00)
GFR calc non Af Amer: >60 mL/min (ref >60)
Glucose, Bld: 96 mg/dL (ref 65–99)
Potassium: 3.7 mmol/L (ref 3.5–5.1)
Sodium: 140 mmol/L (ref 135–145)

## 2015-11-21 LAB — CBC
HEMATOCRIT: 37.4 % (ref 35.0–47.0)
HEMOGLOBIN: 12.9 g/dL (ref 12.0–16.0)
MCH: 33.8 pg (ref 26.0–34.0)
MCHC: 34.6 g/dL (ref 32.0–36.0)
MCV: 97.8 fL (ref 80.0–100.0)
Platelets: 274 10*3/uL (ref 150–440)
RBC: 3.83 MIL/uL (ref 3.80–5.20)
RDW: 13.6 % (ref 11.5–14.5)
WBC: 9.5 10*3/uL (ref 3.6–11.0)

## 2015-11-21 LAB — TROPONIN I: Troponin I: <0.03 ng/mL (ref <0.03)

## 2015-11-21 NOTE — ED Triage Notes (Signed)
Pt ambulatory to triage with steady gait, no distress noted. Pt c/o chest tightness, denies SOB or radiating pain. Pt reports she had an episode last Thursday where she had palpitations, SOB but denies having medical treatment.

## 2015-11-22 ENCOUNTER — Emergency Department
Admission: EM | Admit: 2015-11-22 | Discharge: 2015-11-22 | Disposition: A | Discharge status: Home / Self Care | Payer: BC Managed Care – PPO | Attending: Emergency Medicine | Admitting: Emergency Medicine

## 2015-11-22 DIAGNOSIS — R079 Chest pain, unspecified: Secondary | ICD-10-CM

## 2015-11-22 DIAGNOSIS — R002 Palpitations: Secondary | ICD-10-CM

## 2015-11-22 LAB — FIBRIN DERIVATIVES D-DIMER (ARMC ONLY): Fibrin derivatives D-dimer (ARMC): 205 (ref 0–499)

## 2015-11-22 LAB — TROPONIN I

## 2015-11-22 NOTE — ED Provider Notes (Signed)
[facility] Emergency Department Provider Note    First MD Initiated Contact with Patient 11/22/15 0102     (approximate)  I have reviewed the triage vital signs and the nursing notes.   HISTORY  Chief Complaint Chest Pain    HPI Kelly Vincent is a 45 y.o. female with no past medical history presents to the emergency department with intermittent chest tightness times approximately one week. Patient denies any dyspnea at this time but does admit to dyspnea last Thursday. Patient denies any diaphoresis no dizziness. Patient denies any palpitations at this time. Patient emits to brief episode of left lower extremity pain last week with spontaneous resolution.   Past Medical History:  Diagnosis Date  . Allergy   . Anxiety   . Arthritis     Patient Active Problem List   Diagnosis Date Noted  . Tobacco user 08/19/2012  . Unspecified episodic mood disorder 08/19/2012    Past Surgical History:  Procedure Laterality Date  . OVARIAN CYST SURGERY     rupture of cyst    Prior to Admission medications   Medication Sig Start Date End Date Taking? Authorizing Provider  cetirizine (ZYRTEC) 10 MG tablet Take 10 mg by mouth daily.    Historical Provider, MD  escitalopram (LEXAPRO) 10 MG tablet Take 1/2- 1 tablet every morning as prescribed. Patient not taking: Reported on 05/03/2014 08/06/13   Barton Fanny, MD    Allergies Codeine and Sulfa antibiotics  Family History  Problem Relation Age of Onset  . Adopted: Yes    Social History Social History  Substance Use Topics  . Smoking status: Current Every Day Smoker    Packs/day: 1.00    Years: 30.00    Types: Cigarettes  . Smokeless tobacco: Never Used  . Alcohol use 1.2 oz/week    2 Shots of liquor per week     Comment: LIQUOR 1-2 drinks    Review of Systems Constitutional: No fever/chills Eyes: No visual changes. ENT: No sore throat. Cardiovascular: Positive for chest  pain. Respiratory: Denies shortness of breath. Gastrointestinal: No abdominal pain.  No nausea, no vomiting.  No diarrhea.  No constipation. Genitourinary: Negative for dysuria. Musculoskeletal: Negative for back pain. Skin: Negative for rash. Neurological: Negative for headaches, focal weakness or numbness.  10-point ROS otherwise negative.  ____________________________________________   PHYSICAL EXAM:  VITAL SIGNS: ED Triage Vitals  Enc Vitals Group     BP 11/21/15 2103 114/62     Pulse Rate 11/21/15 2103 80     Resp 11/21/15 2103 15     Temp 11/21/15 2103 98.9 F (37.2 C)     Temp Source 11/21/15 2103 Oral     SpO2 11/21/15 2103 100 %     Weight 11/21/15 2103 110 lb (49.9 kg)     Height 11/21/15 2103 5\' 1"  (1.549 m)     Head Circumference --      Peak Flow --      Pain Score 11/21/15 2104 1     Pain Loc --      Pain Edu? --      Excl. in [place]? --     Constitutional: Alert and oriented. Well appearing and in no acute distress. Eyes: Conjunctivae are normal. PERRL. EOMI. Head: Atraumatic. Nose: No congestion/rhinnorhea. Mouth/Throat: Mucous membranes are moist.  Oropharynx non-erythematous. Neck: No stridor.  No meningeal signs.   Cardiovascular: Normal rate, regular rhythm. Good peripheral circulation. Grossly normal heart sounds. Respiratory: Normal respiratory effort.  No retractions. Lungs CTAB. Gastrointestinal: Soft and nontender. No distention.  Musculoskeletal: No lower extremity tenderness nor edema. No gross deformities of extremities. Neurologic:  Normal speech and language. No gross focal neurologic deficits are appreciated.  Skin:  Skin is warm, dry and intact. No rash noted. Psychiatric: Mood and affect are normal. Speech and behavior are normal.  ____________________________________________   LABS (all labs ordered are listed, but only abnormal results are displayed)  Labs Reviewed  BASIC METABOLIC PANEL  CBC  TROPONIN I  TROPONIN I  FIBRIN  DERIVATIVES D-DIMER ([facility] ONLY)   ____________________________________________  EKG  ED ECG REPORT I, [place] N Jossie Smoot, the attending physician, personally viewed and interpreted this ECG.   Date: 11/22/2015  EKG Time: 9:07 PM  Rate: 76  Rhythm: Normal sinus rhythm  Axis: Normal  Intervals: Normal  ST&T Change: None  ____________________________________________  RADIOLOGY I, [place] N Lutricia Widjaja, personally viewed and evaluated these images (plain radiographs) as part of my medical decision making, as well as reviewing the written report by the radiologist.  Dg Chest 2 View  Result Date: 11/21/2015 CLINICAL DATA:  Initial evaluation for acute chest tightness. EXAM: CHEST  2 VIEW COMPARISON:  Prior radiograph from 08/06/2013. FINDINGS: The cardiac and mediastinal silhouettes are stable in size and contour, and remain within normal limits. The lungs are normally inflated. No airspace consolidation, pleural effusion, or pulmonary edema is identified. There is no pneumothorax. No acute osseous abnormality identified. IMPRESSION: No active cardiopulmonary disease. Electronically Signed   By: Jeannine Boga M.D.   On: 11/21/2015 22:48      Procedures     INITIAL IMPRESSION / ASSESSMENT AND PLAN / ED COURSE  Pertinent labs & imaging results that were available during my care of the patient were reviewed by me and considered in my medical decision making (see chart for details).  No clear etiology identified for the patient's chest pain. Troponin negative 2 EKG unremarkable d-dimer negative and a PERC 0 patient. She'll be referred to cardiologist for further outpatient evaluation and management   Clinical Course    ____________________________________________  FINAL CLINICAL IMPRESSION(S) / ED DIAGNOSES  Final diagnoses:  Nonspecific chest pain     MEDICATIONS GIVEN DURING THIS VISIT:  Medications - No data to display   NEW OUTPATIENT MEDICATIONS STARTED DURING  THIS VISIT:  New Prescriptions   No medications on file    Modified Medications   No medications on file    Discontinued Medications   No medications on file     Note:  This document was prepared using Dragon voice recognition software and may include unintentional dictation errors.    Gregor Hams, MD 11/22/15 [phone]

## 2016-11-29 ENCOUNTER — Emergency Department: Payer: BC Managed Care – PPO

## 2016-11-29 ENCOUNTER — Emergency Department
Admission: EM | Admit: 2016-11-29 | Discharge: 2016-11-29 | Disposition: A | Discharge status: Home / Self Care | Payer: BC Managed Care – PPO | Attending: Emergency Medicine | Admitting: Emergency Medicine

## 2016-11-29 ENCOUNTER — Encounter: Payer: Self-pay | Admitting: Emergency Medicine

## 2016-11-29 DIAGNOSIS — F17210 Nicotine dependence, cigarettes, uncomplicated: Secondary | ICD-10-CM | POA: Diagnosis not present

## 2016-11-29 DIAGNOSIS — R1032 Left lower quadrant pain: Secondary | ICD-10-CM | POA: Diagnosis present

## 2016-11-29 DIAGNOSIS — N23 Unspecified renal colic: Secondary | ICD-10-CM | POA: Diagnosis not present

## 2016-11-29 LAB — PREGNANCY, URINE: PREG TEST UR: NEGATIVE

## 2016-11-29 LAB — URINALYSIS, COMPLETE (UACMP) WITH MICROSCOPIC
BILIRUBIN URINE: NEGATIVE
GLUCOSE, UA: NEGATIVE mg/dL
KETONES UR: NEGATIVE mg/dL
Leukocytes, UA: NEGATIVE
NITRITE: POSITIVE — AB
PROTEIN: NEGATIVE mg/dL
SPECIFIC GRAVITY, URINE: 1.024 (ref 1.005–1.030)
pH: 7 (ref 5.0–8.0)

## 2016-11-29 LAB — BASIC METABOLIC PANEL
Anion gap: 11 (ref 5–15)
BUN: 15 mg/dL (ref 6–20)
CALCIUM: 9 mg/dL (ref 8.9–10.3)
CO2: 24 mmol/L (ref 22–32)
Chloride: 103 mmol/L (ref 101–111)
GLUCOSE: 102 mg/dL — AB (ref 65–99)
Potassium: 3.5 mmol/L (ref 3.5–5.1)
Sodium: 138 mmol/L (ref 135–145)

## 2016-11-29 LAB — CBC
HCT: 39.6 % (ref 35.0–47.0)
Hemoglobin: 13.3 g/dL (ref 12.0–16.0)
MCH: 33.1 pg (ref 26.0–34.0)
MCHC: 33.7 g/dL (ref 32.0–36.0)
MCV: 98.3 fL (ref 80.0–100.0)
PLATELETS: 336 10*3/uL (ref 150–440)
RBC: 4.03 MIL/uL (ref 3.80–5.20)
RDW: 13.4 % (ref 11.5–14.5)
WBC: 10.9 10*3/uL (ref 3.6–11.0)

## 2016-11-29 MED ORDER — ONDANSETRON 4 MG PO TBDP: 4.0000 mg | ORAL_TABLET | Freq: Three times a day (TID) | ORAL | 0 refills | Status: DC | PRN

## 2016-11-29 MED ORDER — SODIUM CHLORIDE 0.9 % IV SOLN
Freq: Once | INTRAVENOUS | Status: AC
  Administered 2016-11-29: 18:00:00 via INTRAVENOUS

## 2016-11-29 MED ORDER — KETOROLAC TROMETHAMINE 30 MG/ML IJ SOLN
30.0000 mg | Freq: Once | INTRAMUSCULAR | Status: AC
  Administered 2016-11-29: 30 mg via INTRAVENOUS
  Filled 2016-11-29: qty 1

## 2016-11-29 MED ORDER — ONDANSETRON HCL 4 MG/2ML IJ SOLN
4.0000 mg | Freq: Once | INTRAMUSCULAR | Status: AC
  Administered 2016-11-29: 4 mg via INTRAVENOUS
  Filled 2016-11-29: qty 2

## 2016-11-29 MED ORDER — LORAZEPAM 2 MG/ML IJ SOLN: 0.5000 mg | Freq: Once | INTRAMUSCULAR | Status: DC

## 2016-11-29 MED ORDER — LORAZEPAM 2 MG/ML IJ SOLN
0.5000 mg | Freq: Once | INTRAMUSCULAR | Status: AC
  Administered 2016-11-29: 0.5 mg via INTRAVENOUS
  Filled 2016-11-29: qty 1
  Filled 2016-11-29: qty 0.25

## 2016-11-29 MED ORDER — PHENAZOPYRIDINE HCL 200 MG PO TABS
200.0000 mg | ORAL_TABLET | Freq: Once | ORAL | Status: DC
  Filled 2016-11-29 (×2): qty 1

## 2016-11-29 MED ORDER — KETOROLAC TROMETHAMINE 10 MG PO TABS: 10.0000 mg | ORAL_TABLET | Freq: Four times a day (QID) | ORAL | 0 refills | Status: DC | PRN

## 2016-11-29 MED ORDER — TAMSULOSIN HCL 0.4 MG PO CAPS: 0.4000 mg | ORAL_CAPSULE | Freq: Every day | ORAL | 0 refills | Status: DC

## 2016-11-29 NOTE — ED Provider Notes (Signed)
[facility] Emergency Department Provider Note       Time seen: ----------------------------------------- 5:11 PM on 11/29/2016 -----------------------------------------     I have reviewed the triage vital signs and the nursing notes.   HISTORY   Chief Complaint Flank Pain    HPI Kelly Vincent is a 46 y.o. female with a history of arthritis who presents to the ED for urethral burning and left flank pain. Patient denies any history of kidney stones. Currently she is having severe burning and left flank pain that is 10 out of 10. She denies any fevers, chest pain, shortness of breath but has had some mild nausea. She took some Azo prior to arrival without any significant improvement.  Past Medical History:  Diagnosis Date  . Allergy   . Anxiety   . Arthritis     Patient Active Problem List   Diagnosis Date Noted  . Tobacco user 08/19/2012  . Unspecified episodic mood disorder 08/19/2012    Past Surgical History:  Procedure Laterality Date  . OVARIAN CYST SURGERY     rupture of cyst    Allergies Codeine and Sulfa antibiotics  Social History Social History  Substance Use Topics  . Smoking status: Current Every Day Smoker    Packs/day: 1.00    Years: 30.00    Types: Cigarettes  . Smokeless tobacco: Never Used  . Alcohol use 1.2 oz/week    2 Shots of liquor per week     Comment: LIQUOR 1-2 drinks    Review of Systems Constitutional: Negative for fever. Eyes: Negative for vision changes ENT:  Negative for congestion, sore throat Cardiovascular: Negative for chest pain. Respiratory: Negative for shortness of breath. Gastrointestinal: Positive for flank pain Genitourinary: Positive for dysuria Musculoskeletal: Positive for back pain Skin: Negative for rash. Neurological: Negative for headaches, focal weakness or numbness.  All systems negative/normal/unremarkable except as stated in the  HPI  ____________________________________________   PHYSICAL EXAM:  VITAL SIGNS: ED Triage Vitals  Enc Vitals Group     BP 11/29/16 1638 117/73     Pulse Rate 11/29/16 1638 71     Resp 11/29/16 1638 17     Temp 11/29/16 1638 98.1 F (36.7 C)     Temp Source 11/29/16 1638 Oral     SpO2 11/29/16 1638 100 %     Weight 11/29/16 1634 115 lb (52.2 kg)     Height 11/29/16 1634 5\' 5"  (1.651 m)     Head Circumference --      Peak Flow --      Pain Score 11/29/16 1634 9     Pain Loc --      Pain Edu? --      Excl. in [name]? --     Constitutional: Alert and oriented. Mild to moderate distress from pain Eyes: Conjunctivae are normal. Normal extraocular movements. ENT   Head: Normocephalic and atraumatic.   Nose: No congestion/rhinnorhea.   Mouth/Throat: Mucous membranes are moist.   Neck: No stridor. Cardiovascular: Normal rate, regular rhythm. No murmurs, rubs, or gallops. Respiratory: Normal respiratory effort without tachypnea nor retractions. Breath sounds are clear and equal bilaterally. No wheezes/rales/rhonchi. Gastrointestinal: Left flank and possibly left CVA tenderness. Normal bowel sounds. Musculoskeletal: Nontender with normal range of motion in extremities. No lower extremity tenderness nor edema. Neurologic:  Normal speech and language. No gross focal neurologic deficits are appreciated.  Skin:  Skin is warm, dry and intact. No rash noted. Psychiatric: Mood and affect are normal. Speech and  behavior are normal.   ____________________________________________  ED COURSE:  Pertinent labs & imaging results that were available during my care of the patient were reviewed by me and considered in my medical decision making (see chart for details). Patient presents for flank pain, we will assess with labs and imaging as indicated. Clinical Course as of Nov 29 1833  Thu Nov 29, 2016  1730 Preliminary lab testing indicates likely renal colic.  [JW]    Clinical  Course User Index [JW] Earleen Newport, MD   Procedures ____________________________________________   LABS (pertinent positives/negatives)  Labs Reviewed  BASIC METABOLIC PANEL - Abnormal; Notable for the following:       Result Value   Glucose, Bld 102 (*)    Creatinine, Ser <0.30 (*)    All other components within normal limits  CBC  URINALYSIS, COMPLETE (UACMP) WITH MICROSCOPIC  POC URINE PREG, ED    RADIOLOGY Images were viewed by me  CT renal protocol reveals a distal left ureteral stone  IMPRESSION: 1. Mild to moderate left hydronephrosis and hydroureter, secondary to a 3-4 mm stone at the left UVJ. There are intrarenal stones present in the left kidney 2. There are otherwise no acute abnormalities visualized.  ____________________________________________  DIFFERENTIAL DIAGNOSIS   UTI, pyelonephritis, renal colic, muscle strain, STD, sciatica  FINAL ASSESSMENT AND PLAN  Renal colic   Plan: Patient had presented for flank pain and dysuria. Patients labs revealed only hematuria without any other acute findings. Patients imaging revealed a 3-4 mm stone at the left UVJ. She'll be discharged with Flomax, pain medicine and antiemetics. She is stable for outpatient follow-up.   Earleen Newport, MD   Note: This note was generated in part or whole with voice recognition software. Voice recognition is usually quite accurate but there are transcription errors that can and very often do occur. I apologize for any typographical errors that were not detected and corrected.     Earleen Newport, MD 11/29/16 Bosie Helper

## 2016-11-29 NOTE — ED Notes (Signed)
Patient transported to CT 

## 2016-11-29 NOTE — ED Triage Notes (Signed)
Pt come into the ED via POV c/o sudden onset of urethral burning and left flank pain.  Denies any history of kidney stones.  Denies any fevers, chest pain, shortness of breath.  States she has been having mild nausea since it happened.  Patient ambulatory to triage at this time.  Patient took azo 1 hr prior to arrival.

## 2017-08-12 ENCOUNTER — Ambulatory Visit: Payer: BC Managed Care – PPO | Admitting: Family Medicine

## 2017-08-12 ENCOUNTER — Other Ambulatory Visit: Payer: Self-pay

## 2017-08-12 ENCOUNTER — Encounter: Payer: Self-pay | Admitting: Family Medicine

## 2017-08-12 VITALS — BP 90/62 | HR 88 | Temp 99.1°F | Ht 65.75 in | Wt 106.8 lb

## 2017-08-12 DIAGNOSIS — F39 Unspecified mood [affective] disorder: Secondary | ICD-10-CM

## 2017-08-12 DIAGNOSIS — R634 Abnormal weight loss: Secondary | ICD-10-CM

## 2017-08-12 DIAGNOSIS — E46 Unspecified protein-calorie malnutrition: Secondary | ICD-10-CM

## 2017-08-12 DIAGNOSIS — A6000 Herpesviral infection of urogenital system, unspecified: Secondary | ICD-10-CM | POA: Diagnosis not present

## 2017-08-12 DIAGNOSIS — Z720 Tobacco use: Secondary | ICD-10-CM

## 2017-08-12 DIAGNOSIS — Z681 Body mass index (BMI) 19 or less, adult: Secondary | ICD-10-CM

## 2017-08-12 MED ORDER — ALPRAZOLAM 0.5 MG PO TABS: 0.5000 mg | ORAL_TABLET | Freq: Every day | ORAL | 0 refills | Status: DC | PRN

## 2017-08-12 MED ORDER — VALACYCLOVIR HCL 1 G PO TABS: 1000.0000 mg | ORAL_TABLET | Freq: Every day | ORAL | 2 refills | Status: AC

## 2017-08-12 NOTE — Progress Notes (Addendum)
7/8/201911:01 AM  [place] 10-Apr-1970, 47 y.o. female [id number]  Chief Complaint  Patient presents with  . Anxiety    looking to est care, has situational anxiety due to having autistic son that is 74yo. Dx with bipolar in the past at the age of 47yo. Currently not on any medication. Looking to get Lexapro prn    HPI:   Patient is a 47 y.o. female with past medical history significant for bipolar and anxiety disorder who presents today to establish care  Dx bipolar at age 24, inpatient treatment, on drugs at that time, counseling upto mid 33s Reports prior to that dx with ADHD - trial of ritalin - "horrible" Trial of depakote - too blunting wellbutrin - too giddy Did not take lexapro for too long Denies any mania or depression, feels mood overall steady Did counseling recently due to work stressors, special ed teacher Anxiety - situational remains an issue Mostly related to caring for her high functioning autistic and bipolar son She reports very good sleep Requesting a small rx for bzd to take very prn, has not had a prescription in years [place] CSR reviewed  Also has h/o recurrent genital herpes outbreak, 4-6 a year, has not been on antivirals for a long time Concern for weight loss, has lost about 12 lbs in the last year, she thinks it is related to her stressors from work She does have a mild upset stomach, has noticed decreased appetite, gets diarrhea prior to her menses but otherwise no issues She does drink an ensure 3-4 times a week, cost is limiting factor She reports heavy menses with clots, last about 2 weeks, irregular, has been like that for years    Fall Risk  08/12/2017 05/03/2014 09/10/2013  Falls in the past year? No No Yes  Comment - - slipped on gumball     Depression screen [facility] 2/9 08/12/2017 05/03/2014 09/10/2013  Decreased Interest 0 0 0  Down, Depressed, Hopeless 0 0 0  PHQ - 2 Score 0 0 0   GAD 7 : Generalized Anxiety Score 08/12/2017  Nervous,  Anxious, on Edge 2  Control/stop worrying 2  Worry too much - different things 2  Trouble relaxing 2  Restless 2  Easily annoyed or irritable 2  Afraid - awful might happen 2  Total GAD 7 Score 14  Anxiety Difficulty Somewhat difficult      Allergies  Allergen Reactions  . Codeine Nausea And Vomiting  . Sulfa Antibiotics Hives and Rash    Prior to Admission medications   Medication Sig Start Date End Date Taking? Authorizing Provider  cetirizine (ZYRTEC) 10 MG tablet Take 10 mg by mouth daily.    [provider]    Past Medical History:  Diagnosis Date  . Allergy   . Anxiety   . Arthritis     Past Surgical History:  Procedure Laterality Date  . OVARIAN CYST SURGERY     rupture of cyst    Social History   Tobacco Use  . Smoking status: Current Every Day Smoker    Packs/day: 1.00    Years: 30.00    Pack years: 30.00    Types: Cigarettes  . Smokeless tobacco: Never Used  Substance Use Topics  . Alcohol use: Yes    Alcohol/week: 1.2 oz    Types: 2 Shots of liquor per week    Comment: LIQUOR 1-2 drinks    Family History  Adopted: Yes  Problem Relation Age of Onset  .  Autism Son     Review of Systems  Constitutional: Positive for malaise/fatigue and weight loss. Negative for chills and fever.  Respiratory: Negative for cough and shortness of breath.   Cardiovascular: Positive for palpitations. Negative for chest pain and leg swelling.  Gastrointestinal: Positive for diarrhea and nausea. Negative for abdominal pain, heartburn and vomiting.  Psychiatric/Behavioral: The patient is nervous/anxious. The patient does not have insomnia.    Per hpi  OBJECTIVE: Blood pressure 90/62, pulse 88, temperature 99.1 F (37.3 C), temperature source Oral, height 5' 5.75" (1.67 m), weight 106 lb 12.8 oz (48.4 kg), last menstrual period 08/05/2017, SpO2 99 %.  Wt Readings from Last 3 Encounters:  08/12/17 106 lb 12.8 oz (48.4 kg)  11/29/16 115 lb (52.2 kg)    11/21/15 110 lb (49.9 kg)   Body mass index is 17.37 kg/m.  Physical Exam  Constitutional: She is oriented to person, place, and time.  HENT:  Head: Normocephalic and atraumatic.  Right Ear: Hearing, tympanic membrane, external ear and ear canal normal.  Left Ear: Hearing, tympanic membrane, external ear and ear canal normal.  Mouth/Throat: Oropharynx is clear and moist.  Eyes: Pupils are equal, round, and reactive to light. EOM are normal.  Neck: Neck supple.  Cardiovascular: Normal rate, regular rhythm and normal heart sounds. Exam reveals no gallop and no friction rub.  No murmur heard. Pulmonary/Chest: Effort normal and breath sounds normal. She has no wheezes. She has no rales.  Lymphadenopathy:    She has no cervical adenopathy.  Neurological: She is alert and oriented to person, place, and time.  Skin: Skin is warm and dry.    ASSESSMENT and PLAN  1. Episodic mood disorder ([place])  patient reports diagnosis of bipolar in teens, no recent psych care. She is not interested in perusing further. She would like a small prescription for very prn use. [place] CSR reviewed - no concerns. Rx given. Will cont to monitor clinically.   2. Loss of weight 5. BMI less than 19, adult 6. Protein-calorie malnutrition, unspecified severity Patient believes related to stress/anxiety at work environment. Discussed nutritional supplementation. Labs per below. Consider further workup if continued weight loss.  - CBC with Differential/Platelet - Comprehensive metabolic panel - TSH - T4, Free - Ferritin - Vitamin D, 25-hydroxy  3. Recurrent genital herpes Patient not interested in suppressive therapy. rx given for treatment of outbreaks.   4. Tobacco user Precontemplative. Smoking cessation instruction/counseling given f or 3-10 minutes:  counseled patient on the dangers of tobacco use, advised patient to stop smoking, and reviewed strategies to maximize success  Other orders - valACYclovir  (VALTREX) 1000 MG tablet; Take 1 tablet (1,000 mg total) by mouth daily for 5 days. - ALPRAZolam (XANAX) 0.5 MG tablet; Take 1 tablet (0.5 mg total) by mouth daily as needed for anxiety.  Return in about 3 months (around 11/12/2017) for weight loss.    Rutherford Guys, MD [facility] [place], [place] [address] Ph.  [phone] Fax [phone]

## 2017-08-12 NOTE — Patient Instructions (Signed)
     IF you received an x-ray today, you will receive an invoice from [facility]. Please contact [facility] at [phone] with questions or concerns regarding your invoice.   IF you received labwork today, you will receive an invoice from [facility]. Please contact [facility] at [phone] with questions or concerns regarding your invoice.   Our billing staff will not be able to assist you with questions regarding bills from these companies.  You will be contacted with the lab results as soon as they are available. The fastest way to get your results is to activate your My Chart account. Instructions are located on the last page of this paperwork. If you have not heard from us regarding the results in 2 weeks, please contact this office.     

## 2017-08-13 LAB — CBC WITH DIFFERENTIAL/PLATELET
Basophils Absolute: 0 10*3/uL (ref 0.0–0.2)
Basos: 0 %
EOS (ABSOLUTE): 0.1 10*3/uL (ref 0.0–0.4)
Eos: 1 %
Hematocrit: 41.8 % (ref 34.0–46.6)
Hemoglobin: 14 g/dL (ref 11.1–15.9)
Immature Grans (Abs): 0 10*3/uL (ref 0.0–0.1)
Immature Granulocytes: 0 %
Lymphocytes Absolute: 2.8 10*3/uL (ref 0.7–3.1)
Lymphs: 20 %
MCH: 32.9 pg (ref 26.6–33.0)
MCHC: 33.5 g/dL (ref 31.5–35.7)
MCV: 98 fL — ABNORMAL HIGH (ref 79–97)
Monocytes Absolute: 0.6 10*3/uL (ref 0.1–0.9)
Monocytes: 4 %
Neutrophils Absolute: 10.1 10*3/uL — ABNORMAL HIGH (ref 1.4–7.0)
Neutrophils: 75 %
Platelets: 347 10*3/uL (ref 150–450)
RBC: 4.26 x10E6/uL (ref 3.77–5.28)
RDW: 14.2 % (ref 12.3–15.4)
WBC: 13.6 10*3/uL — ABNORMAL HIGH (ref 3.4–10.8)

## 2017-08-13 LAB — COMPREHENSIVE METABOLIC PANEL
ALT: 9 IU/L (ref 0–32)
AST: 15 IU/L (ref 0–40)
Albumin/Globulin Ratio: 1.7 (ref 1.2–2.2)
Albumin: 4.4 g/dL (ref 3.5–5.5)
Alkaline Phosphatase: 62 IU/L (ref 39–117)
BUN/Creatinine Ratio: 12 (ref 9–23)
BUN: 9 mg/dL (ref 6–24)
Bilirubin Total: 0.4 mg/dL (ref 0.0–1.2)
CO2: 24 mmol/L (ref 20–29)
Calcium: 9.6 mg/dL (ref 8.7–10.2)
Chloride: 102 mmol/L (ref 96–106)
Creatinine, Ser: 0.75 mg/dL (ref 0.57–1.00)
GFR calc Af Amer: 109 mL/min/{1.73_m2} (ref >59)
GFR calc non Af Amer: 95 mL/min/{1.73_m2} (ref >59)
Globulin, Total: 2.6 g/dL (ref 1.5–4.5)
Glucose: 71 mg/dL (ref 65–99)
Potassium: 4.8 mmol/L (ref 3.5–5.2)
Sodium: 142 mmol/L (ref 134–144)
Total Protein: 7 g/dL (ref 6.0–8.5)

## 2017-08-13 LAB — VITAMIN D 25 HYDROXY (VIT D DEFICIENCY, FRACTURES): Vit D, 25-Hydroxy: 18.6 ng/mL — ABNORMAL LOW (ref 30.0–100.0)

## 2017-08-13 LAB — TSH: TSH: 1.2 u[IU]/mL (ref 0.450–4.500)

## 2017-08-13 LAB — T4, FREE: Free T4: 1.19 ng/dL (ref 0.82–1.77)

## 2017-08-13 LAB — FERRITIN: Ferritin: 37 ng/mL (ref 15–150)

## 2017-08-24 ENCOUNTER — Ambulatory Visit (INDEPENDENT_AMBULATORY_CARE_PROVIDER_SITE_OTHER): Payer: BC Managed Care – PPO | Admitting: Family Medicine

## 2017-08-24 ENCOUNTER — Other Ambulatory Visit: Payer: Self-pay

## 2017-08-24 ENCOUNTER — Encounter: Payer: Self-pay | Admitting: Family Medicine

## 2017-08-24 VITALS — BP 95/61 | HR 82 | Temp 98.5°F | Resp 18 | Ht 65.47 in | Wt 107.2 lb

## 2017-08-24 DIAGNOSIS — F17210 Nicotine dependence, cigarettes, uncomplicated: Secondary | ICD-10-CM

## 2017-08-24 DIAGNOSIS — Z0000 Encounter for general adult medical examination without abnormal findings: Secondary | ICD-10-CM | POA: Diagnosis not present

## 2017-08-24 DIAGNOSIS — F17200 Nicotine dependence, unspecified, uncomplicated: Secondary | ICD-10-CM

## 2017-08-24 DIAGNOSIS — E559 Vitamin D deficiency, unspecified: Secondary | ICD-10-CM

## 2017-08-24 MED ORDER — NICOTINE 14 MG/24HR TD PT24: 14.0000 mg | MEDICATED_PATCH | Freq: Every day | TRANSDERMAL | 0 refills | Status: DC

## 2017-08-24 MED ORDER — VITAMIN D (ERGOCALCIFEROL) 1.25 MG (50000 UNIT) PO CAPS: 50000.0000 [IU] | ORAL_CAPSULE | ORAL | 0 refills | Status: DC

## 2017-08-24 NOTE — Progress Notes (Signed)
7/20/20199:11 AM  [place] 1970/12/04, 47 y.o. female [id number]  Chief Complaint  Patient presents with  . Annual Exam  . Depression    screening done  . Anxiety    screening done    HPI:   Patient is a 47 y.o. female with past medical history significant for mood disorder who presents today for CPE  Cervical Cancer Screening: scheduled with [facility] for Sep 24 2017 Breast Cancer Screening: per gyn Colorectal Cancer Screening: n/a Bone Density Testing: n/a Seasonal Influenza Vaccination: this season Td/Tdap Vaccination: 2015 Frequency of Dental evaluation: undergoing current regular care Frequency of Eye evaluation: wears readers, does not see eye doctor regularly  phq9 and gad 7 noted - patient reports related to stress, not interested in pursuing other treatment options Has been eating better, smaller more frequent meals, goal is to gain 5 lbs in 2 months She has been also smoking more of recent upto 1ppd, normally 1/2-3/4 ppd Has been smoking for over 20 years Tried wellbutrin - made her very goofy Afraid of chantix due to her mood disorder Has never tried patches, has tried gum Has done one smoking cessation session thru [facility], has a second one Has reached out to 1800 quit now in the past Getting ready to try quiting again  Fall Risk  08/24/2017 08/12/2017 05/03/2014 09/10/2013  Falls in the past year? No No No Yes  Comment - - - slipped on gumball     Depression screen [facility] 2/9 08/24/2017 08/12/2017 05/03/2014  Decreased Interest 1 0 0  Down, Depressed, Hopeless 1 0 0  PHQ - 2 Score 2 0 0  Altered sleeping 0 - -  Tired, decreased energy 2 - -  Change in appetite 2 - -  Feeling bad or failure about yourself  1 - -  Trouble concentrating 3 - -  Moving slowly or fidgety/restless 2 - -  Suicidal thoughts 0 - -  PHQ-9 Score 12 - -  Difficult doing work/chores Somewhat difficult - -   GAD 7 : Generalized Anxiety Score 08/24/2017 08/12/2017 08/12/2017  Nervous,  Anxious, on Edge 2 2 2   Control/stop worrying 1 2 2   Worry too much - different things 1 2 2   Trouble relaxing 1 2 2   Restless 1 2 2   Easily annoyed or irritable 1 2 2   Afraid - awful might happen 1 2 2   Total GAD 7 Score 8 14 14   Anxiety Difficulty Extremely difficult - Somewhat difficult    Allergies  Allergen Reactions  . Codeine Nausea And Vomiting  . Sulfa Antibiotics Hives and Rash    Prior to Admission medications   Medication Sig Start Date End Date Taking? Authorizing Provider  ALPRAZolam Duanne Moron) 0.5 MG tablet Take 1 tablet (0.5 mg total) by mouth daily as needed for anxiety. Patient not taking: Reported on 08/24/2017 08/12/17   Rutherford Guys, MD  cetirizine (ZYRTEC) 10 MG tablet Take 10 mg by mouth daily.    [provider]    Past Medical History:  Diagnosis Date  . Allergy   . Anxiety   . Arthritis   . Herpes     Past Surgical History:  Procedure Laterality Date  . OVARIAN CYST SURGERY     rupture of cyst    Social History   Tobacco Use  . Smoking status: Current Every Day Smoker    Packs/day: 1.00    Years: 30.00    Pack years: 30.00    Types: Cigarettes  .  Smokeless tobacco: Never Used  Substance Use Topics  . Alcohol use: Yes    Alcohol/week: 1.2 oz    Types: 2 Shots of liquor per week    Comment: LIQUOR 1-2 drinks    Family History  Adopted: Yes  Problem Relation Age of Onset  . Autism Son     Review of Systems  Constitutional: Positive for malaise/fatigue. Negative for chills and fever.  HENT: Negative for congestion, ear pain, hearing loss and sore throat.   Eyes: Positive for blurred vision (nighttime glare bothers her sometime). Negative for double vision.  Respiratory: Negative for cough and shortness of breath.   Cardiovascular: Negative for chest pain, palpitations and leg swelling.  Gastrointestinal: Positive for diarrhea (related to menses). Negative for abdominal pain, blood in stool, constipation, melena, nausea and  vomiting.  Genitourinary: Positive for urgency. Negative for dysuria and hematuria.  Musculoskeletal: Negative for falls, joint pain and myalgias.  Skin: Negative for rash.  Neurological: Negative for dizziness, tingling and headaches.  Endo/Heme/Allergies: Negative for polydipsia.  Psychiatric/Behavioral: Negative for substance abuse and suicidal ideas. The patient does not have insomnia.     OBJECTIVE:  Blood pressure 95/61, pulse 82, temperature 98.5 F (36.9 C), temperature source Oral, resp. rate 18, height 5' 5.47" (1.663 m), weight 107 lb 3.2 oz (48.6 kg), last menstrual period 08/05/2017, SpO2 99 %.  Wt Readings from Last 3 Encounters:  08/24/17 107 lb 3.2 oz (48.6 kg)  08/12/17 106 lb 12.8 oz (48.4 kg)  11/29/16 115 lb (52.2 kg)    Visual Acuity Screening   Right eye Left eye Both eyes  Without correction: 20/20 20/20 20/20   With correction:      Physical Exam  Constitutional: She is oriented to person, place, and time. She appears well-developed and well-nourished.  HENT:  Head: Normocephalic and atraumatic.  Right Ear: Hearing, tympanic membrane, external ear and ear canal normal.  Left Ear: Hearing, tympanic membrane, external ear and ear canal normal.  Mouth/Throat: Oropharynx is clear and moist.  Eyes: Pupils are equal, round, and reactive to light. Conjunctivae and EOM are normal.  Neck: Neck supple. No thyromegaly present.  Cardiovascular: Normal rate, regular rhythm, normal heart sounds and intact distal pulses. Exam reveals no gallop and no friction rub.  No murmur heard. Pulmonary/Chest: Effort normal and breath sounds normal. She has no wheezes. She has no rales.  Abdominal: Soft. Bowel sounds are normal. She exhibits no distension and no mass. There is no tenderness.  Musculoskeletal: Normal range of motion. She exhibits no edema.  Lymphadenopathy:    She has no cervical adenopathy.  Neurological: She is alert and oriented to person, place, and time. She  has normal reflexes. No cranial nerve deficit. Gait normal.  Skin: Skin is warm and dry.  Psychiatric: She has a normal mood and affect.  Nursing note and vitals reviewed.    ASSESSMENT and PLAN  1. Annual physical exam Routine HCM labs ordered last visit, reviewed today. Normal except for low vitamin d. HCM reviewed/discussed. Anticipatory guidance regarding healthy weight, lifestyle and choices given.   2. Vitamin D deficiency Starting high dose vitamin D. Recheck lab at 3 months. - Vitamin D, 25-hydroxy; Future  3. Tobacco use disorder Smoking cessation instruction/counseling given for more than 10 minutes:  counseled patient on the dangers of tobacco use, advised patient to stop smoking, and reviewed strategies to maximize success   Other orders - Vitamin D, Ergocalciferol, (DRISDOL) 50000 units CAPS capsule; Take 1 capsule (50,000 Units total)  by mouth every 7 (seven) days. - nicotine (NICODERM CQ - DOSED IN MG/24 HOURS) 14 mg/24hr patch; Place 1 patch (14 mg total) onto the skin daily.  Return in about 3 months (around 11/24/2017).    Rutherford Guys, MD Primary Care at [facility] [place], [place] [address] Ph.  [phone] Fax [phone]

## 2017-08-24 NOTE — Patient Instructions (Addendum)
   IF you received an x-ray today, you will receive an invoice from [facility]. Please contact  Radiology at [phone] with questions or concerns regarding your invoice.   IF you received labwork today, you will receive an invoice from [facility]. Please contact [facility] at [phone] with questions or concerns regarding your invoice.   Our billing staff will not be able to assist you with questions regarding bills from these companies.  You will be contacted with the lab results as soon as they are available. The fastest way to get your results is to activate your My Chart account. Instructions are located on the last page of this paperwork. If you have not heard from us regarding the results in 2 weeks, please contact this office.     Preventive Care 40-64 Years, Female Preventive care refers to lifestyle choices and visits with your health care provider that can promote health and wellness. What does preventive care include?  A yearly physical exam. This is also called an annual well check.  Dental exams once or twice a year.  Routine eye exams. Ask your health care provider how often you should have your eyes checked.  Personal lifestyle choices, including: ? Daily care of your teeth and gums. ? Regular physical activity. ? Eating a healthy diet. ? Avoiding tobacco and drug use. ? Limiting alcohol use. ? Practicing safe sex. ? Taking low-dose aspirin daily starting at age 50. ? Taking vitamin and mineral supplements as recommended by your health care provider. What happens during an annual well check? The services and screenings done by your health care provider during your annual well check will depend on your age, overall health, lifestyle risk factors, and family history of disease. Counseling Your health care provider may ask you questions about your:  Alcohol use.  Tobacco use.  Drug use.  Emotional well-being.  Home and relationship  well-being.  Sexual activity.  Eating habits.  Work and work environment.  Method of birth control.  Menstrual cycle.  Pregnancy history.  Screening You may have the following tests or measurements:  Height, weight, and BMI.  Blood pressure.  Lipid and cholesterol levels. These may be checked every 5 years, or more frequently if you are over 50 years old.  Skin check.  Lung cancer screening. You may have this screening every year starting at age 55 if you have a 30-pack-year history of smoking and currently smoke or have quit within the past 15 years.  Fecal occult blood test (FOBT) of the stool. You may have this test every year starting at age 50.  Flexible sigmoidoscopy or colonoscopy. You may have a sigmoidoscopy every 5 years or a colonoscopy every 10 years starting at age 50.  Hepatitis C blood test.  Hepatitis B blood test.  Sexually transmitted disease (STD) testing.  Diabetes screening. This is done by checking your blood sugar (glucose) after you have not eaten for a while (fasting). You may have this done every 1-3 years.  Mammogram. This may be done every 1-2 years. Talk to your health care provider about when you should start having regular mammograms. This may depend on whether you have a family history of breast cancer.  BRCA-related cancer screening. This may be done if you have a family history of breast, ovarian, tubal, or peritoneal cancers.  Pelvic exam and Pap test. This may be done every 3 years starting at age 21. Starting at age 30, this may be done every 5 years if you   have a Pap test in combination with an HPV test.  Bone density scan. This is done to screen for osteoporosis. You may have this scan if you are at high risk for osteoporosis.  Discuss your test results, treatment options, and if necessary, the need for more tests with your health care provider. Vaccines Your health care provider may recommend certain vaccines, such  as:  Influenza vaccine. This is recommended every year.  Tetanus, diphtheria, and acellular pertussis (Tdap, Td) vaccine. You may need a Td booster every 10 years.  Varicella vaccine. You may need this if you have not been vaccinated.  Zoster vaccine. You may need this after age 60.  Measles, mumps, and rubella (MMR) vaccine. You may need at least one dose of MMR if you were born in 1957 or later. You may also need a second dose.  Pneumococcal 13-valent conjugate (PCV13) vaccine. You may need this if you have certain conditions and were not previously vaccinated.  Pneumococcal polysaccharide (PPSV23) vaccine. You may need one or two doses if you smoke cigarettes or if you have certain conditions.  Meningococcal vaccine. You may need this if you have certain conditions.  Hepatitis A vaccine. You may need this if you have certain conditions or if you travel or work in places where you may be exposed to hepatitis A.  Hepatitis B vaccine. You may need this if you have certain conditions or if you travel or work in places where you may be exposed to hepatitis B.  Haemophilus influenzae type b (Hib) vaccine. You may need this if you have certain conditions.  Talk to your health care provider about which screenings and vaccines you need and how often you need them. This information is not intended to replace advice given to you by your health care provider. Make sure you discuss any questions you have with your health care provider. Document Released: 02/18/2015 Document Revised: 10/12/2015 Document Reviewed: 11/23/2014 Elsevier Interactive Patient Education  2018 Elsevier Inc.  

## 2017-10-01 ENCOUNTER — Encounter: Payer: Self-pay | Admitting: Family Medicine

## 2017-11-14 ENCOUNTER — Telehealth: Payer: Self-pay | Admitting: Family Medicine

## 2017-11-14 ENCOUNTER — Ambulatory Visit: Payer: BC Managed Care – PPO | Admitting: Family Medicine

## 2017-11-14 NOTE — Telephone Encounter (Signed)
Copied from [place] [id number]. Topic: Quick Communication - Rx Refill/Question >> Nov 14, 2017  9:21 AM Bea Graff, NT wrote: Medication: ALPRAZolam Duanne Moron) 0.5 MG tablet   Has the patient contacted their pharmacy? Yes.   (Agent: If no, request that the patient contact the pharmacy for the refill.) (Agent: If yes, when and what did the pharmacy advise?)  Preferred Pharmacy (with phone number or street name): [id number] [address], [place] [phone] (Phone) [phone] (Fax)    Agent: Please be advised that RX refills may take up to 3 business days. We ask that you follow-up with your pharmacy.

## 2017-11-14 NOTE — Telephone Encounter (Signed)
Refill medication request message sent to Dr. Pamella Pert

## 2017-11-17 MED ORDER — ALPRAZOLAM 0.5 MG PO TABS: 0.5000 mg | ORAL_TABLET | Freq: Every day | ORAL | 0 refills | Status: DC | PRN

## 2017-11-17 NOTE — Telephone Encounter (Signed)
pmp reviewed  Med refilled 

## 2017-12-21 ENCOUNTER — Encounter: Payer: Self-pay | Admitting: Family Medicine

## 2017-12-21 ENCOUNTER — Other Ambulatory Visit: Payer: Self-pay

## 2017-12-21 ENCOUNTER — Ambulatory Visit: Payer: BC Managed Care – PPO | Admitting: Family Medicine

## 2017-12-21 VITALS — BP 108/66 | HR 98 | Temp 98.1°F | Resp 12 | Ht 65.0 in | Wt 115.0 lb

## 2017-12-21 DIAGNOSIS — F17200 Nicotine dependence, unspecified, uncomplicated: Secondary | ICD-10-CM | POA: Diagnosis not present

## 2017-12-21 DIAGNOSIS — F39 Unspecified mood [affective] disorder: Secondary | ICD-10-CM

## 2017-12-21 DIAGNOSIS — R634 Abnormal weight loss: Secondary | ICD-10-CM | POA: Diagnosis not present

## 2017-12-21 DIAGNOSIS — E559 Vitamin D deficiency, unspecified: Secondary | ICD-10-CM

## 2017-12-21 MED ORDER — ALPRAZOLAM 0.5 MG PO TABS: 0.5000 mg | ORAL_TABLET | Freq: Every day | ORAL | 0 refills | Status: DC | PRN

## 2017-12-21 NOTE — Progress Notes (Signed)
11/16/20199:22 AM  [place] September 24, 1970, 47 y.o. female [id number]  Chief Complaint  Patient presents with  . Follow-up    weight issues, Has gained weight     HPI:   Patient is a 47 y.o. female with past medical history significant for mood disorder, tobacco use who presents today for follouwp on weight  Last visit had lost weight due to stressors This was concerning to patient Has regained weight Overall doing well Anxiety is sign improved She takes xanax 1/4 tab prn, not daily Work is much better   Has not tried nicotine patches Not ready to quit yet  Never took vitamin d  Eating better, doing ensure daily   Fall Risk  12/21/2017 08/24/2017 08/12/2017 05/03/2014 09/10/2013  Falls in the past year? 0 No No No Yes  Comment - - - - slipped on gumball     Depression screen [facility] 2/9 12/21/2017 08/24/2017 08/12/2017  Decreased Interest 0 1 0  Down, Depressed, Hopeless 0 1 0  PHQ - 2 Score 0 2 0  Altered sleeping - 0 -  Tired, decreased energy - 2 -  Change in appetite - 2 -  Feeling bad or failure about yourself  - 1 -  Trouble concentrating - 3 -  Moving slowly or fidgety/restless - 2 -  Suicidal thoughts - 0 -  PHQ-9 Score - 12 -  Difficult doing work/chores - Somewhat difficult -    Allergies  Allergen Reactions  . Codeine Nausea And Vomiting  . Sulfa Antibiotics Hives and Rash    Prior to Admission medications   Medication Sig Start Date End Date Taking? Authorizing Provider  ALPRAZolam Duanne Moron) 0.5 MG tablet Take 1 tablet (0.5 mg total) by mouth daily as needed for anxiety. 11/17/17  Yes Rutherford Guys, MD  cetirizine (ZYRTEC) 10 MG tablet Take 10 mg by mouth daily.   Yes [provider]  nicotine (NICODERM CQ - DOSED IN MG/24 HOURS) 14 mg/24hr patch Place 1 patch (14 mg total) onto the skin daily. Patient not taking: Reported on 12/21/2017 08/24/17   Rutherford Guys, MD  Vitamin D, Ergocalciferol, (DRISDOL) 50000 units CAPS capsule Take 1  capsule (50,000 Units total) by mouth every 7 (seven) days. Patient not taking: Reported on 12/21/2017 08/24/17   Rutherford Guys, MD    Past Medical History:  Diagnosis Date  . Allergy   . Anxiety   . Arthritis   . Herpes     Past Surgical History:  Procedure Laterality Date  . OVARIAN CYST SURGERY     rupture of cyst    Social History   Tobacco Use  . Smoking status: Current Every Day Smoker    Packs/day: 1.00    Years: 30.00    Pack years: 30.00    Types: Cigarettes  . Smokeless tobacco: Never Used  Substance Use Topics  . Alcohol use: Yes    Alcohol/week: 2.0 standard drinks    Types: 2 Shots of liquor per week    Comment: LIQUOR 1-2 drinks    Family History  Adopted: Yes  Problem Relation Age of Onset  . Autism Son     ROS Per hpi  OBJECTIVE:  Blood pressure 108/66, pulse 98, temperature 98.1 F (36.7 C), temperature source Oral, resp. rate 12, height 5\' 5"  (1.651 m), weight 115 lb (52.2 kg), SpO2 98 %. Body mass index is 19.14 kg/m.   Wt Readings from Last 3 Encounters:  12/21/17 115 lb (52.2 kg)  08/24/17 107 lb 3.2 oz (48.6 kg)  08/12/17 106 lb 12.8 oz (48.4 kg)    Physical Exam  Constitutional: She is oriented to person, place, and time. She appears well-developed and well-nourished.  HENT:  Head: Normocephalic and atraumatic.  Mouth/Throat: Mucous membranes are normal.  Eyes: Pupils are equal, round, and reactive to light. Conjunctivae and EOM are normal. No scleral icterus.  Neck: Neck supple.  Pulmonary/Chest: Effort normal.  Neurological: She is alert and oriented to person, place, and time.  Skin: Skin is warm and dry.  Psychiatric: She has a normal mood and affect.  Nursing note and vitals reviewed.   ASSESSMENT and PLAN  1. Episodic mood disorder (HCC) Improved. Overall stable. pmp reviewed. Xanax refilled  2. Loss of weight Much improved. Back to normal BMI  3. Vitamin D deficiency Will recheck at next physical. Cont with  healthy diet  4. Tobacco use disorder precontmeplative  Other orders - ALPRAZolam (XANAX) 0.5 MG tablet; Take 1 tablet (0.5 mg total) by mouth daily as needed for anxiety.  Return for CPE  July 2020.    Rutherford Guys, MD Primary Care at [facility] [place], [place] [address] Ph.  [phone] Fax [phone]

## 2017-12-21 NOTE — Patient Instructions (Signed)
° ° ° °  If you have lab work done today you will be contacted with your lab results within the next 2 weeks.  If you have not heard from us then please contact us. The fastest way to get your results is to register for My Chart. ° ° °IF you received an x-ray today, you will receive an invoice from [facility]. Please contact [facility] at [phone] with questions or concerns regarding your invoice.  ° °IF you received labwork today, you will receive an invoice from [facility]. Please contact [facility] at [phone] with questions or concerns regarding your invoice.  ° °Our billing staff will not be able to assist you with questions regarding bills from these companies. ° °You will be contacted with the lab results as soon as they are available. The fastest way to get your results is to activate your My Chart account. Instructions are located on the last page of this paperwork. If you have not heard from us regarding the results in 2 weeks, please contact this office. °  ° ° ° °

## 2018-06-10 ENCOUNTER — Telehealth: Payer: Self-pay | Admitting: Family Medicine

## 2018-06-11 NOTE — Telephone Encounter (Signed)
pmp reviewed  Med refilled 

## 2018-06-11 NOTE — Telephone Encounter (Signed)
Patient is requesting a refill of the following medications: Requested Prescriptions   Pending Prescriptions Disp Refills  . ALPRAZolam (XANAX) 0.5 MG tablet [Pharmacy Med Name: ALPRAZOLAM 0.5MG  TABLETS] 20 tablet     Sig: TAKE 1 TABLET(0.5 MG) BY MOUTH DAILY AS NEEDED FOR ANXIETY    Date of patient request: 06/10/18 Last office visit: 12/22/18 Date of last refill: 12/22/18 Last refill amount: 20 Follow up time period per chart: none

## 2018-09-15 ENCOUNTER — Other Ambulatory Visit: Payer: Self-pay | Admitting: Family Medicine

## 2018-09-15 NOTE — Telephone Encounter (Signed)
Requested medications are due for refill today?  Yes  Requested medications are on the active medication list?  Yes  Last refill:  06/11/2018 #20, 0 refills   Future visit scheduled?  No  Notes to clinic  Requested Prescriptions  Pending Prescriptions Disp Refills   ALPRAZolam (XANAX) 0.5 MG tablet [Pharmacy Med Name: ALPRAZOLAM 0.5MG  TABLETS] 20 tablet     Sig: TAKE 1 TABLET(0.5 MG) BY MOUTH DAILY AS NEEDED FOR ANXIETY     Not Delegated - Psychiatry:  Anxiolytics/Hypnotics Failed - 09/15/2018 12:22 PM      Failed - This refill cannot be delegated      Failed - Urine Drug Screen completed in last 360 days.      Failed - Valid encounter within last 6 months    Recent Outpatient Visits          8 months ago Episodic mood disorder [facility])   [facility] at [facility], Lilia Argue, MD   1 year ago Annual physical exam   [facility] at [facility], Lilia Argue, MD   1 year ago Episodic mood disorder [facility])   [facility] at [facility], Lilia Argue, MD   4 years ago Episodic mood disorder   Primary Care at [facility], [place], [place]   5 years ago Routine general medical examination at a health care facility   [facility] at [facility], Lavone Neri, MD

## 2018-09-18 NOTE — Telephone Encounter (Signed)
Pt called and would like to know why this med was refused.  She stated that Dr Pamella Pert refill this med for her.  Please advise if she needs an appt  Best number [phone]

## 2018-09-18 NOTE — Telephone Encounter (Signed)
Medication refused, needs an appt with [place]

## 2018-09-30 ENCOUNTER — Encounter: Payer: Self-pay | Admitting: Family Medicine

## 2018-09-30 ENCOUNTER — Telehealth (INDEPENDENT_AMBULATORY_CARE_PROVIDER_SITE_OTHER): Payer: BC Managed Care – PPO | Admitting: Family Medicine

## 2018-09-30 DIAGNOSIS — F17200 Nicotine dependence, unspecified, uncomplicated: Secondary | ICD-10-CM | POA: Diagnosis not present

## 2018-09-30 DIAGNOSIS — E559 Vitamin D deficiency, unspecified: Secondary | ICD-10-CM

## 2018-09-30 DIAGNOSIS — F4322 Adjustment disorder with anxiety: Secondary | ICD-10-CM | POA: Diagnosis not present

## 2018-09-30 MED ORDER — ALPRAZOLAM 0.5 MG PO TABS: ORAL_TABLET | ORAL | 2 refills | Status: DC

## 2018-09-30 MED ORDER — VITAMIN D (ERGOCALCIFEROL) 1.25 MG (50000 UNIT) PO CAPS: 50000.0000 [IU] | ORAL_CAPSULE | ORAL | 0 refills | Status: DC

## 2018-09-30 MED ORDER — NICOTINE POLACRILEX 2 MG MT GUM: 2.0000 mg | CHEWING_GUM | OROMUCOSAL | 1 refills | Status: DC | PRN

## 2018-09-30 NOTE — Progress Notes (Signed)
Pt is needing a refill on the xanax, vit d and nicotine patch. Says her next bday , she will be 48 and would like to stop smoking. She has also gained 10lbs. No other medical concerns at this time

## 2018-09-30 NOTE — Progress Notes (Signed)
Virtual Visit Note  I connected with patient on 09/30/18 at 446pm by phone and verified that I am speaking with the correct person using two identifiers. Kelly Vincent is currently located at car and patient is currently with them during visit. The provider, Rutherford Guys, MD is located in their office at time of visit.  I discussed the limitations, risks, security and privacy concerns of performing an evaluation and management service by telephone and the availability of in person appointments. I also discussed with the patient that there may be a patient responsible charge related to this service. The patient expressed understanding and agreed to proceed.   CC: med refill  HPI ? She needs refill of xanax, might be losing her job pmp reviewed Takes very prn Last refill in may #20 Has gained about 10lbs Wt Readings from Last 3 Encounters:  12/21/17 115 lb (52.2 kg)  08/24/17 107 lb 3.2 oz (48.6 kg)  08/12/17 106 lb 12.8 oz (48.4 kg)   Has a new dental partial in place Will be seeing obgyn in [place] interested in quitting smoking  Currently smoking 1ppd Would like to try nictotine gum She never took vitamin D last year when rx a year ago Last vitamin D level was 18  Allergies  Allergen Reactions  . Codeine Nausea And Vomiting  . Sulfa Antibiotics Hives and Rash    Prior to Admission medications   Medication Sig Start Date End Date Taking? Authorizing Provider  ALPRAZolam (XANAX) 0.5 MG tablet TAKE 1 TABLET(0.5 MG) BY MOUTH DAILY AS NEEDED FOR ANXIETY 06/11/18   Rutherford Guys, MD  cetirizine (ZYRTEC) 10 MG tablet Take 10 mg by mouth daily.    [provider]  nicotine (NICODERM CQ - DOSED IN MG/24 HOURS) 14 mg/24hr patch Place 1 patch (14 mg total) onto the skin daily. Patient not taking: Reported on 12/21/2017 08/24/17   Rutherford Guys, MD  Vitamin D, Ergocalciferol, (DRISDOL) 50000 units CAPS capsule Take 1 capsule (50,000 Units total) by mouth every 7  (seven) days. Patient not taking: Reported on 12/21/2017 08/24/17   Rutherford Guys, MD    Past Medical History:  Diagnosis Date  . Allergy   . Anxiety   . Arthritis   . Herpes     Past Surgical History:  Procedure Laterality Date  . OVARIAN CYST SURGERY     rupture of cyst    Social History   Tobacco Use  . Smoking status: Current Every Day Smoker    Packs/day: 1.00    Years: 30.00    Pack years: 30.00    Types: Cigarettes  . Smokeless tobacco: Never Used  Substance Use Topics  . Alcohol use: Yes    Alcohol/week: 2.0 standard drinks    Types: 2 Shots of liquor per week    Comment: LIQUOR 1-2 drinks    Family History  Adopted: Yes  Problem Relation Age of Onset  . Autism Son     ROS Per hpi  Objective  Vitals as reported by the patient: none   ASSESSMENT and PLAN  1. Adjustment disorder with anxious mood Work and home stressors. pmp reviewed. Refilled xanax, offered counseling if needed.   2. Vitamin D deficiency rx sent again. Check levels at next OV  3. Tobacco use disorder Congratulated patient on her plans/intentions. Trial of nicotiene gum. She is not interested in other options.  Other orders - ALPRAZolam (XANAX) 0.5 MG tablet; TAKE 1 TABLET(0.5 MG) BY MOUTH DAILY  AS NEEDED FOR ANXIETY - Vitamin D, Ergocalciferol, (DRISDOL) 1.25 MG (50000 UT) CAPS capsule; Take 1 capsule (50,000 Units total) by mouth every 7 (seven) days. - nicotine polacrilex (GOODSENSE NICOTINE) 2 MG gum; Take 1 each (2 mg total) by mouth as needed for smoking cessation.  FOLLOW-UP: 3-6 months   The above assessment and management plan was discussed with the patient. The patient verbalized understanding of and has agreed to the management plan. Patient is aware to call the clinic if symptoms persist or worsen. Patient is aware when to return to the clinic for a follow-up visit. Patient educated on when it is appropriate to go to the emergency department.    I provided 6  minutes of non-face-to-face time during this encounter.  Rutherford Guys, MD Primary Care at [place], [place] [address] Ph.  [phone] Fax [phone]

## 2019-01-22 ENCOUNTER — Other Ambulatory Visit: Payer: Self-pay

## 2019-01-22 ENCOUNTER — Encounter: Payer: Self-pay | Admitting: Adult Health

## 2019-01-22 ENCOUNTER — Ambulatory Visit (INDEPENDENT_AMBULATORY_CARE_PROVIDER_SITE_OTHER): Payer: BC Managed Care – PPO | Admitting: Adult Health

## 2019-01-22 VITALS — BP 110/68 | HR 90 | Ht 65.5 in | Wt 115.0 lb

## 2019-01-22 DIAGNOSIS — F331 Major depressive disorder, recurrent, moderate: Secondary | ICD-10-CM | POA: Diagnosis not present

## 2019-01-22 DIAGNOSIS — G4700 Insomnia, unspecified: Secondary | ICD-10-CM

## 2019-01-22 DIAGNOSIS — F319 Bipolar disorder, unspecified: Secondary | ICD-10-CM

## 2019-01-22 DIAGNOSIS — F411 Generalized anxiety disorder: Secondary | ICD-10-CM | POA: Diagnosis not present

## 2019-01-22 MED ORDER — OLANZAPINE 5 MG PO TABS: ORAL_TABLET | ORAL | 2 refills | Status: DC

## 2019-01-22 NOTE — Progress Notes (Signed)
[facility] MD/PA/NP Initial Note  01/22/2019 3:49 PM Kelly Vincent  MRN:  [id number]  Chief Complaint:  Chief Complaint    Depression; Anxiety; Insomnia; Manic Behavior      HPI:   Describes mood today as "'so-so". Pleasant. Communicative. Mood symptoms - reports depression, anxiety, and irritability. Stating "I feel numb". Stating "there's no room for me". Has felt this way for "years". 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 Feels like she needs a medication to help with "mood" symptoms. Has a prescription for Xanax and is worried that she could get "addicted" to it. Has struggled with "addiction" issues in her teens and is "worried". Would like to try something to help "level her out". Using THC to "calm her down and help her focus". Is tearful "all the time". Previously diagnosed with Bipolar Disorder. Stating "I stay up". Will have long periods of "going, going, and going" and then crashes. Has difficulties sitting down and doing her job. Having conflict with superior on the job.  Is stressed out with work. Making a lot of mistakes. Is afraid of losing her job. Seeing therapist - Lucita Ferrara - has seen over the years. Has a Bipolar "1" diagnosis. Worried about 60 year old son who also suffers from BPD1, anxiety, PTSD, and ADHD. Feels overwhelmed with all she has to "deal" with. Varying interest and motivation. Taking medications as prescribed.  Energy levels stable. Active, does not have a regular exercise routine. Walks dog everyday. Works full-time as a Chief Technology Officer.  Enjoys some usual interests and activities. Single. Lives at two separate houses - spends 4 days a week with son (25) and then stays with boyfriend for 3 days. Spending time with family. 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  Appetite adequate.  Weight gain of 10 pounds over past year. Drinks one boost a day. Has struggled to maintain weight - anxiety and moods "play a part".  Sleeps well most nights. Averages 5  to 8 or 9 hours. Focus and concentration difficulties - stating "it's awful. Has a hard time with "auditory" stimulation. Diagnosed with ADHD in teens. Completing tasks. Managing aspects of household.  Denies SI or HI. Denies AH or VH.  Previous medications: Depakote, Lexapro  Visit Diagnosis:    ICD-10-CM   1. Insomnia, unspecified type  G47.00   2. Bipolar I disorder ([facility])  F31.9   3. Major depressive disorder, recurrent episode, moderate (HCC)  F33.1   4. Generalized anxiety disorder  F41.1     Past Psychiatric History: Admitted at age 67 at [facility] - overdosed on Ritalin and Champagne. Has used THC and LSD.  Past Medical History:  Past Medical History:  Diagnosis Date  . Allergy   . Anxiety   . Arthritis   . Herpes     Past Surgical History:  Procedure Laterality Date  . OVARIAN CYST SURGERY     rupture of cyst    Family Psychiatric History: Adopted - no knowledge of biological parents mental health circustances.  Family History:  Family History  Adopted: Yes  Problem Relation Age of Onset  . Autism Son     Social History:  Social History   Socioeconomic History  . Marital status: Single    Spouse name: Not on file  . Number of children: 1  . Years of education: Not on file  . Highest education level: Not on file  Occupational History  . Occupation: special Public relations account executive: [employer]  Tobacco Use  . Smoking status: Current Every Day Smoker    Packs/day: 1.00    Years: 30.00    Pack years: 30.00    Types: Cigarettes  . Smokeless tobacco: Never Used  Substance and Sexual Activity  . Alcohol use: Yes    Alcohol/week: 2.0 standard drinks    Types: 2 Shots of liquor per week    Comment: LIQUOR 1-2 drinks  . Drug use: No  . Sexual activity: Yes    Comment: Fiancee  Other Topics Concern  . Not on file  Social History Narrative   Significant other   College education   Works as a Environmental consultant   Social Determinants of Radio broadcast assistant Strain:   . Difficulty of Paying Living Expenses: Not on file  Food Insecurity:   . Worried About Charity fundraiser in the Last Year: Not on file  . Ran Out of Food in the Last Year: Not on file  Transportation Needs:   . Lack of Transportation (Medical): Not on file  . Lack of Transportation (Non-Medical): Not on file  Physical Activity:   . Days of Exercise per Week: Not on file  . Minutes of Exercise per Session: Not on file  Stress:   . Feeling of Stress : Not on file  Social Connections:   . Frequency of Communication with Friends and Family: Not on file  . Frequency of Social Gatherings with Friends and Family: Not on file  . Attends Religious Services: Not on file  . Active Member of Clubs or Organizations: Not on file  . Attends Archivist Meetings: Not on file  . Marital Status: Not on file    Allergies:  Allergies  Allergen Reactions  . Codeine Nausea And Vomiting  . Short Ragweed Pollen Ext   . Sulfa Antibiotics Hives, Rash and Itching    Metabolic Disorder Labs: No results found for: HGBA1C, MPG No results found for: PROLACTIN Lab Results  Component Value Date   CHOL 130 09/10/2013   TRIG 60 09/10/2013   HDL 39 (L) 09/10/2013   CHOLHDL 3.3 09/10/2013   VLDL 12 09/10/2013   LDLCALC 79 09/10/2013   LDLCALC 82 08/19/2012   Lab Results  Component Value Date   TSH 1.200 08/12/2017   TSH 1.151 08/19/2012    Therapeutic Level Labs: No results found for: LITHIUM No results found for: VALPROATE No components found for:  CBMZ  Current Medications: Current Outpatient Medications  Medication Sig Dispense Refill  . ALPRAZolam (XANAX) 0.5 MG tablet TAKE 1 TABLET(0.5 MG) BY MOUTH DAILY AS NEEDED FOR ANXIETY 20 tablet 2  . cetirizine (ZYRTEC) 10 MG tablet Take 10 mg by mouth daily.    . nicotine polacrilex (GOODSENSE NICOTINE) 2 MG gum Take 1 each (2 mg total) by mouth as needed for  smoking cessation. 100 tablet 1  . Vitamin D, Ergocalciferol, (DRISDOL) 1.25 MG (50000 UT) CAPS capsule Take 1 capsule (50,000 Units total) by mouth every 7 (seven) days. 12 capsule 0   No current facility-administered medications for this visit.    Medication Side Effects: none  Orders placed this visit:  No orders of the defined types were placed in this encounter.   Psychiatric Specialty Exam:  Review of Systems  Musculoskeletal: Negative for gait problem.  Neurological: Negative for tremors.  Psychiatric/Behavioral:       Please refer to HPI    Blood pressure 110/68, pulse 90, height 5'  5.5" (1.664 m), weight 115 lb (52.2 kg).Body mass index is 18.85 kg/m.  General Appearance: Neat and Well Groomed  Eye Contact:  Good  Speech:  Clear and Coherent and Normal Rate  Volume:  Normal  Mood:  Euthymic  Affect:  Appropriate and Congruent  Thought Process:  Coherent and Descriptions of Associations: Intact  Orientation:  Full (Time, Place, and Person)  Thought Content: Logical   Suicidal Thoughts:  No  Homicidal Thoughts:  No  Memory:  WNL  Judgement:  Good  Insight:  Good  Psychomotor Activity:  Normal  Concentration:  Concentration: Good  Recall:  Good  Fund of Knowledge: Good  Language: Good  Assets:  Communication Skills Desire for Improvement Financial Resources/Insurance Housing Intimacy Leisure Time Physical Health Resilience Social Support Talents/Skills Transportation Vocational/Educational  ADL's:  Intact  Cognition: WNL  Prognosis:  Good   Screenings:  GAD-7     Office Visit from 08/24/2017 in Primary Care at [facility] from 08/12/2017 in Primary Care at [facility]  Total GAD-7 Score  8  14    PHQ2-9     Telemedicine from 09/30/2018 in Primary Care at [facility] from 12/21/2017 in [facility] at [place] from 08/24/2017 in [facility] at [facility] from 08/12/2017 in [facility] at [facility] from 05/03/2014 in  Primary Care at [facility] Total Score  0  0  2  0  0  PHQ-9 Total Score  --  --  12  --  --      Receiving Psychotherapy: Yes - Lucita Ferrara.  Treatment Plan/Recommendations:   Plan:  1. Xanax 0.5mg  daily thru PCP - takes 0.25mg  some days 2. Add Xyprexa 5mg  - 1/2 tablet at hs x 3 nights, then one tablet daily   RTC 4 weeks  Patient advised to contact office with any questions, adverse effects, or acute worsening in signs and symptoms.    Aloha Gell, NP

## 2019-02-19 ENCOUNTER — Other Ambulatory Visit: Payer: Self-pay

## 2019-02-19 ENCOUNTER — Ambulatory Visit (INDEPENDENT_AMBULATORY_CARE_PROVIDER_SITE_OTHER): Payer: BC Managed Care – PPO | Admitting: Adult Health

## 2019-02-19 ENCOUNTER — Encounter: Payer: Self-pay | Admitting: Adult Health

## 2019-02-19 DIAGNOSIS — G4700 Insomnia, unspecified: Secondary | ICD-10-CM

## 2019-02-19 DIAGNOSIS — F411 Generalized anxiety disorder: Secondary | ICD-10-CM

## 2019-02-19 DIAGNOSIS — N63 Unspecified lump in unspecified breast: Secondary | ICD-10-CM | POA: Insufficient documentation

## 2019-02-19 DIAGNOSIS — F331 Major depressive disorder, recurrent, moderate: Secondary | ICD-10-CM | POA: Diagnosis not present

## 2019-02-19 DIAGNOSIS — F319 Bipolar disorder, unspecified: Secondary | ICD-10-CM
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
## 2019-03-19 ENCOUNTER — Ambulatory Visit (INDEPENDENT_AMBULATORY_CARE_PROVIDER_SITE_OTHER): Payer: BC Managed Care – PPO | Admitting: Adult Health

## 2019-03-19 ENCOUNTER — Encounter: Payer: Self-pay | Admitting: Adult Health

## 2019-03-19 DIAGNOSIS — F319 Bipolar disorder, unspecified: Secondary | ICD-10-CM

## 2019-03-19 DIAGNOSIS — G4700 Insomnia, unspecified: Secondary | ICD-10-CM

## 2019-03-19 DIAGNOSIS — F331 Major depressive disorder, recurrent, moderate: Secondary | ICD-10-CM | POA: Diagnosis not present

## 2019-03-19 DIAGNOSIS — F411 Generalized anxiety disorder: Secondary | ICD-10-CM

## 2019-03-19 MED ORDER — OLANZAPINE 5 MG PO TABS: ORAL_TABLET | ORAL | 2 refills | Status: DC

## 2019-03-19 NOTE — Progress Notes (Signed)
Kelly Vincent [id number] 1970/06/05 49 y.o.  Virtual Visit via Telephone Note  I connected with pt on 03/19/19 at  3:40 PM EST by telephone and verified that I am speaking with the correct person using two identifiers.   I discussed the limitations, risks, security and privacy concerns of performing an evaluation and management service by telephone and the availability of in person appointments. I also discussed with the patient that there may be a patient responsible charge related to this service. The patient expressed understanding and agreed to proceed.   I discussed the assessment and treatment plan with the patient. The patient was provided an opportunity to ask questions and all were answered. The patient agreed with the plan and demonstrated an understanding of the instructions.   The patient was advised to call back or seek an in-person evaluation if the symptoms worsen or if the condition fails to improve as anticipated.  I provided 30 minutes of non-face-to-face time during this encounter.  The patient was located at home.  The provider was located at [facility].   Aloha Gell, NP   Subjective:   Patient ID:  Kelly Vincent is a 49 y.o. (DOB 02-11-1970) female.  Chief Complaint:  Chief Complaint  Patient presents with  . Anxiety  . Depression  . Insomnia  . Other    BPD    HPI Kelly Vincent presents for follow-up of GAD, MDD, Insomnia, and BPD 1.  Describes mood today as "'ok". Pleasant. Decreased tearfulness. Mood symptoms - reports decreased depression, anxiety, and irritability. Stress worse in the afternoon. Son is her biggest stressor. Still having issues waking up in the morning. Using Xanax occasionally for anxiety. Feels like medications are working for her. Able to stay "calmer". Stating "I'm keeping myself together". Seeing therapist - Lucita Ferrara. Varying interest and motivation. Taking medications as prescribed.  Energy levels stable.  Active, does not have a regular exercise routine. Walks dog everyday. Works full-time as a Chief Technology Officer.  Enjoys some usual interests and activities. Single. Lives at two separate houses.Staying with boyfriend 5 days a week and at home for 2 nights a week. Handling issues with so a little "better".  Appetite increased - "thrilled with that". Weight stable - 120 pounds. Sleeps well most nights. Averages 8 or 9 hours - feels groggy in the morning. Stating "I like to sleep".  Focus and concentration difficulties. Forgetful at times - "a little spacey". Diagnosed with ADHD in teens. Completing tasks. Managing aspects of household.  Denies SI or HI. Denies AH or VH.  Previous medications: Depakote, Lexapro  Review of Systems:  Review of Systems  Musculoskeletal: Negative for gait problem.  Neurological: Negative for tremors.  Psychiatric/Behavioral:       Please refer to HPI   Medications: I have reviewed the patient's current medications.  Current Outpatient Medications  Medication Sig Dispense Refill  . ALPRAZolam (XANAX) 0.5 MG tablet TAKE 1 TABLET(0.5 MG) BY MOUTH DAILY AS NEEDED FOR ANXIETY 20 tablet 2  . cetirizine (ZYRTEC) 10 MG tablet Take 10 mg by mouth daily.    . nicotine polacrilex (GOODSENSE NICOTINE) 2 MG gum Take 1 each (2 mg total) by mouth as needed for smoking cessation. 100 tablet 1  . OLANZapine (ZYPREXA) 5 MG tablet Take 1/2 tablet daily x 3 nights, then one tablet at bedtime. 30 tablet 2  . Vitamin D, Ergocalciferol, (DRISDOL) 1.25 MG (50000 UT) CAPS capsule Take 1 capsule (50,000 Units total) by mouth  every 7 (seven) days. 12 capsule 0   No current facility-administered medications for this visit.    Medication Side Effects: None  Allergies:  Allergies  Allergen Reactions  . Codeine Nausea And Vomiting  . Short Ragweed Pollen Ext   . Sulfa Antibiotics Hives, Rash and Itching    Past Medical History:  Diagnosis Date  . Allergy   . Anxiety   .  Arthritis   . Herpes     Family History  Adopted: Yes  Problem Relation Age of Onset  . Autism Son     Social History   Socioeconomic History  . Marital status: Single    Spouse name: Not on file  . Number of children: 1  . Years of education: Not on file  . Highest education level: Not on file  Occupational History  . Occupation: special Public relations account executive: [employer]  Tobacco Use  . Smoking status: Current Every Day Smoker    Packs/day: 1.00    Years: 30.00    Pack years: 30.00    Types: Cigarettes  . Smokeless tobacco: Never Used  Substance and Sexual Activity  . Alcohol use: Yes    Alcohol/week: 2.0 standard drinks    Types: 2 Shots of liquor per week    Comment: LIQUOR 1-2 drinks  . Drug use: No  . Sexual activity: Yes    Comment: Fiancee  Other Topics Concern  . Not on file  Social History Narrative   Significant other   College education   Works as a Nutritional therapist   Social Determinants of Radio broadcast assistant Strain:   . Difficulty of Paying Living Expenses: Not on file  Food Insecurity:   . Worried About Charity fundraiser in the Last Year: Not on file  . Ran Out of Food in the Last Year: Not on file  Transportation Needs:   . Lack of Transportation (Medical): Not on file  . Lack of Transportation (Non-Medical): Not on file  Physical Activity:   . Days of Exercise per Week: Not on file  . Minutes of Exercise per Session: Not on file  Stress:   . Feeling of Stress : Not on file  Social Connections:   . Frequency of Communication with Friends and Family: Not on file  . Frequency of Social Gatherings with Friends and Family: Not on file  . Attends Religious Services: Not on file  . Active Member of Clubs or Organizations: Not on file  . Attends Archivist Meetings: Not on file  . Marital Status: Not on file  Intimate Partner Violence:   . Fear of Current or Ex-Partner:  Not on file  . Emotionally Abused: Not on file  . Physically Abused: Not on file  . Sexually Abused: Not on file    Past Medical History, Surgical history, Social history, and Family history were reviewed and updated as appropriate.   Please see review of systems for further details on the patient's review from today.   Objective:   Physical Exam:  There were no vitals taken for this visit.  Physical Exam Constitutional:      General: She is not in acute distress.    Appearance: She is well-developed.  Musculoskeletal:        General: No deformity.  Neurological:     Mental Status: She is alert and oriented to person, place, and time.     Coordination: Coordination  normal.  Psychiatric:        Attention and Perception: Attention and perception normal. She does not perceive auditory or visual hallucinations.        Mood and Affect: Mood normal. Mood is not anxious or depressed. Affect is not labile, blunt, angry or inappropriate.        Speech: Speech normal.        Behavior: Behavior normal.        Thought Content: Thought content normal. Thought content is not paranoid or delusional. Thought content does not include homicidal or suicidal ideation. Thought content does not include homicidal or suicidal plan.        Cognition and Memory: Cognition and memory normal.        Judgment: Judgment normal.     Comments: Insight intact     Lab Review:     Component Value Date/Time   NA 142 08/12/2017 1352   K 4.8 08/12/2017 1352   CL 102 08/12/2017 1352   CO2 24 08/12/2017 1352   GLUCOSE 71 08/12/2017 1352   GLUCOSE 102 (H) 11/29/2016 1636   BUN 9 08/12/2017 1352   CREATININE 0.75 08/12/2017 1352   CREATININE 0.76 09/10/2013 1403   CALCIUM 9.6 08/12/2017 1352   PROT 7.0 08/12/2017 1352   ALBUMIN 4.4 08/12/2017 1352   AST 15 08/12/2017 1352   ALT 9 08/12/2017 1352   ALKPHOS 62 08/12/2017 1352   BILITOT 0.4 08/12/2017 1352   GFRNONAA 95 08/12/2017 1352   GFRAA 109  08/12/2017 1352       Component Value Date/Time   WBC 13.6 (H) 08/12/2017 1352   WBC 10.9 11/29/2016 1636   RBC 4.26 08/12/2017 1352   RBC 4.03 11/29/2016 1636   HGB 14.0 08/12/2017 1352   HCT 41.8 08/12/2017 1352   PLT 347 08/12/2017 1352   MCV 98 (H) 08/12/2017 1352   MCH 32.9 08/12/2017 1352   MCH 33.1 11/29/2016 1636   MCHC 33.5 08/12/2017 1352   MCHC 33.7 11/29/2016 1636   RDW 14.2 08/12/2017 1352   LYMPHSABS 2.8 08/12/2017 1352   MONOABS 0.5 09/10/2013 1403   EOSABS 0.1 08/12/2017 1352   BASOSABS 0.0 08/12/2017 1352    No results found for: POCLITH, LITHIUM   No results found for: PHENYTOIN, PHENOBARB, VALPROATE, CBMZ   .res Assessment: Plan:    Plan:  1. Xanax 0.5mg  daily thru PCP - takes 0.25mg  some days 2. Continue Xyprexa 5mg  - one tablet daily  RTC 4 weeks  Patient advised to contact office with any questions, adverse effects, or acute worsening in signs and symptoms.  Discussed potential metabolic side effects associated with atypical antipsychotics, as well as potential risk for movement side effects. Advised pt to contact office if movement side effects occur.   Shawnetta was seen today for anxiety, depression, insomnia and other.  Diagnoses and all orders for this visit:  Insomnia, unspecified type -     OLANZapine (ZYPREXA) 5 MG tablet; Take 1/2 tablet daily x 3 nights, then one tablet at bedtime.  Bipolar I disorder (HCC) -     OLANZapine (ZYPREXA) 5 MG tablet; Take 1/2 tablet daily x 3 nights, then one tablet at bedtime.  Major depressive disorder, recurrent episode, moderate (HCC) -     OLANZapine (ZYPREXA) 5 MG tablet; Take 1/2 tablet daily x 3 nights, then one tablet at bedtime.  Generalized anxiety disorder -     OLANZapine (ZYPREXA) 5 MG tablet; Take 1/2 tablet daily x 3 nights, then one  tablet at bedtime.    Please see After Visit Summary for patient specific instructions.  No future appointments.  No orders of the defined types were  placed in this encounter.     -------------------------------

## 2019-04-05 ENCOUNTER — Ambulatory Visit: Payer: BC Managed Care – PPO | Attending: Internal Medicine

## 2019-04-05 ENCOUNTER — Other Ambulatory Visit: Payer: Self-pay

## 2019-04-05 DIAGNOSIS — Z23 Encounter for immunization: Secondary | ICD-10-CM

## 2019-04-05 NOTE — Progress Notes (Signed)
   Covid-19 Vaccination Clinic  Name:  Kelly Vincent    MRN: [id number] DOB: 01/17/1971  04/05/2019  [name] was observed post Covid-19 immunization for 15 minutes without incidence. She was provided with Vaccine Information Sheet and instruction to access the V-Safe system.   [name] was instructed to call 911 with any severe reactions post vaccine: Marland Kitchen Difficulty breathing  . Swelling of your face and throat  . A fast heartbeat  . A bad rash all over your body  . Dizziness and weakness    Immunizations Administered    Name Date Dose VIS Date Route   Pfizer COVID-19 Vaccine 04/05/2019 10:05 AM 0.3 mL 01/16/2019 Intramuscular   Manufacturer: [place]   Lot: [id number]   [place]: [id number]

## 2019-04-25 ENCOUNTER — Ambulatory Visit: Payer: BC Managed Care – PPO | Attending: Internal Medicine

## 2019-04-25 DIAGNOSIS — Z23 Encounter for immunization: Secondary | ICD-10-CM

## 2019-04-25 NOTE — Progress Notes (Signed)
   Covid-19 Vaccination Clinic  Name:  Kelly Vincent    MRN: [id number] DOB: 1970-07-01  04/25/2019  [name] was observed post Covid-19 immunization for 15 minutes without incident. She was provided with Vaccine Information Sheet and instruction to access the V-Safe system.   [name] was instructed to call 911 with any severe reactions post vaccine: Marland Kitchen Difficulty breathing  . Swelling of face and throat  . A fast heartbeat  . A bad rash all over body  . Dizziness and weakness   Immunizations Administered    Name Date Dose VIS Date Route   Pfizer COVID-19 Vaccine 04/25/2019 10:11 AM 0.3 mL 01/16/2019 Intramuscular   Manufacturer: [place]   Lot: [id number]   [place]: [id number]

## 2019-05-28 ENCOUNTER — Other Ambulatory Visit: Payer: Self-pay | Admitting: Family Medicine

## 2019-05-28 NOTE — Telephone Encounter (Signed)
Requested  medications are  due for refill today yes  Requested medications are on the active medication list yes  Last refill 8/26  Last visit 8 months ago  Notes to clinic Not delegated.

## 2019-06-02 ENCOUNTER — Encounter: Payer: Self-pay | Admitting: Adult Health

## 2019-06-02 ENCOUNTER — Ambulatory Visit (INDEPENDENT_AMBULATORY_CARE_PROVIDER_SITE_OTHER): Payer: BC Managed Care – PPO | Admitting: Adult Health

## 2019-06-02 ENCOUNTER — Other Ambulatory Visit: Payer: Self-pay

## 2019-06-02 DIAGNOSIS — F331 Major depressive disorder, recurrent, moderate: Secondary | ICD-10-CM | POA: Diagnosis not present

## 2019-06-02 DIAGNOSIS — F319 Bipolar disorder, unspecified: Secondary | ICD-10-CM | POA: Diagnosis not present

## 2019-06-02 DIAGNOSIS — G4700 Insomnia, unspecified: Secondary | ICD-10-CM

## 2019-06-02 DIAGNOSIS — F411 Generalized anxiety disorder: Secondary | ICD-10-CM | POA: Diagnosis not present

## 2019-06-02 MED ORDER — OLANZAPINE 5 MG PO TABS: ORAL_TABLET | ORAL | 5 refills | Status: DC

## 2019-06-02 MED ORDER — ALPRAZOLAM 0.5 MG PO TABS: ORAL_TABLET | ORAL | 2 refills | Status: DC

## 2019-06-02 NOTE — Progress Notes (Signed)
[name] [id number] 1971-01-12 49 y.o.  Subjective:   Patient ID:  Kelly Vincent is a 49 y.o. (DOB 1970-11-13) female.  Chief Complaint: No chief complaint on file.   HPI Kelly Vincent presents to the office today for follow-up of GAD, MDD, Insomnia, and BPD 1.  Describes mood today as "ok". Pleasant. Tearful at times. Mood symptoms - denies depression and anxiety. Feels irritable at times - increased stress in work setting. Feels like Zyprexa continues to work well for her. Tightness in legs - shoes - walking a lot more. Reports some "drooling" at night. Son has a disability hearing upcoming - age 69. Seeing therapist - Lucita Ferrara. Varying interest and motivation. Taking medications as prescribed.  Energy levels "not like I'm used to". Active, does not have a regular exercise routine. Walking dog. Works full-time as a Chief Technology Officer.  Enjoys some usual interests and activities. Single. Staying with boyfriend 5 days a week and at home for 2 nights a week. Appetite adequate. Weight gain - 5 pounds - 125. Boyfriend cooking for her. Sleeps well most nights. Averages 6 to 7 hours. Focus and concentration "that's ok". Completing tasks. Managing aspects of household. Work going well.  Denies SI or HI. Denies AH or VH.  Previous medications: Depakote, Lexapro   GAD-7     Office Visit from 08/24/2017 in Primary Care at [facility] from 08/12/2017 in Primary Care at [facility]  Total GAD-7 Score  8  14    PHQ2-9     Telemedicine from 09/30/2018 in Primary Care at [facility] from 12/21/2017 in Primary Care at [facility] from 08/24/2017 in Primary Care at [facility] from 08/12/2017 in Primary Care at [facility] from 05/03/2014 in Primary Care at [facility] Total Score  0  0  2  0  0  PHQ-9 Total Score  --  --  12  --  --       Review of Systems:  Review of Systems  Musculoskeletal: Negative for gait problem.  Neurological: Negative  for tremors.  Psychiatric/Behavioral:       Please refer to HPI    Medications: I have reviewed the patient's current medications.  Current Outpatient Medications  Medication Sig Dispense Refill  . ALPRAZolam (XANAX) 0.5 MG tablet TAKE 1 TABLET(0.5 MG) BY MOUTH DAILY AS NEEDED FOR ANXIETY 20 tablet 2  . cetirizine (ZYRTEC) 10 MG tablet Take 10 mg by mouth daily.    . nicotine polacrilex (GOODSENSE NICOTINE) 2 MG gum Take 1 each (2 mg total) by mouth as needed for smoking cessation. 100 tablet 1  . OLANZapine (ZYPREXA) 5 MG tablet Take 1/2 tablet daily x 3 nights, then one tablet at bedtime. 30 tablet 2  . Vitamin D, Ergocalciferol, (DRISDOL) 1.25 MG (50000 UT) CAPS capsule Take 1 capsule (50,000 Units total) by mouth every 7 (seven) days. 12 capsule 0   No current facility-administered medications for this visit.    Medication Side Effects: None  Allergies:  Allergies  Allergen Reactions  . Codeine Nausea And Vomiting  . Short Ragweed Pollen Ext   . Sulfa Antibiotics Hives, Rash and Itching    Past Medical History:  Diagnosis Date  . Allergy   . Anxiety   . Arthritis   . Herpes     Family History  Adopted: Yes  Problem Relation Age of Onset  . Autism Son     Social History   Socioeconomic History  .  Marital status: Single    Spouse name: Not on file  . Number of children: 1  . Years of education: Not on file  . Highest education level: Not on file  Occupational History  . Occupation: special Public relations account executive: [employer]  Tobacco Use  . Smoking status: Current Every Day Smoker    Packs/day: 1.00    Years: 30.00    Pack years: 30.00    Types: Cigarettes  . Smokeless tobacco: Never Used  Substance and Sexual Activity  . Alcohol use: Yes    Alcohol/week: 2.0 standard drinks    Types: 2 Shots of liquor per week    Comment: LIQUOR 1-2 drinks  . Drug use: No  . Sexual activity: Yes    Comment: Fiancee  Other Topics  Concern  . Not on file  Social History Narrative   Significant other   College education   Works as a Nutritional therapist   Social Determinants of Radio broadcast assistant Strain:   . Difficulty of Paying Living Expenses:   Food Insecurity:   . Worried About Charity fundraiser in the Last Year:   . Arboriculturist in the Last Year:   Transportation Needs:   . Film/video editor (Medical):   Marland Kitchen Lack of Transportation (Non-Medical):   Physical Activity:   . Days of Exercise per Week:   . Minutes of Exercise per Session:   Stress:   . Feeling of Stress :   Social Connections:   . Frequency of Communication with Friends and Family:   . Frequency of Social Gatherings with Friends and Family:   . Attends Religious Services:   . Active Member of Clubs or Organizations:   . Attends Archivist Meetings:   Marland Kitchen Marital Status:   Intimate Partner Violence:   . Fear of Current or Ex-Partner:   . Emotionally Abused:   Marland Kitchen Physically Abused:   . Sexually Abused:     Past Medical History, Surgical history, Social history, and Family history were reviewed and updated as appropriate.   Please see review of systems for further details on the patient's review from today.   Objective:   Physical Exam:  There were no vitals taken for this visit.  Physical Exam Constitutional:      General: She is not in acute distress. Musculoskeletal:        General: No deformity.  Neurological:     Mental Status: She is alert and oriented to person, place, and time.     Coordination: Coordination normal.  Psychiatric:        Attention and Perception: Attention and perception normal. She does not perceive auditory or visual hallucinations.        Mood and Affect: Mood normal. Mood is not anxious or depressed. Affect is not labile, blunt, angry or inappropriate.        Speech: Speech normal.        Behavior: Behavior normal.        Thought Content: Thought content  normal. Thought content is not paranoid or delusional. Thought content does not include homicidal or suicidal ideation. Thought content does not include homicidal or suicidal plan.        Cognition and Memory: Cognition and memory normal.        Judgment: Judgment normal.     Comments: Insight intact     Lab Review:     Component  Value Date/Time   NA 142 08/12/2017 1352   K 4.8 08/12/2017 1352   CL 102 08/12/2017 1352   CO2 24 08/12/2017 1352   GLUCOSE 71 08/12/2017 1352   GLUCOSE 102 (H) 11/29/2016 1636   BUN 9 08/12/2017 1352   CREATININE 0.75 08/12/2017 1352   CREATININE 0.76 09/10/2013 1403   CALCIUM 9.6 08/12/2017 1352   PROT 7.0 08/12/2017 1352   ALBUMIN 4.4 08/12/2017 1352   AST 15 08/12/2017 1352   ALT 9 08/12/2017 1352   ALKPHOS 62 08/12/2017 1352   BILITOT 0.4 08/12/2017 1352   GFRNONAA 95 08/12/2017 1352   GFRAA 109 08/12/2017 1352       Component Value Date/Time   WBC 13.6 (H) 08/12/2017 1352   WBC 10.9 11/29/2016 1636   RBC 4.26 08/12/2017 1352   RBC 4.03 11/29/2016 1636   HGB 14.0 08/12/2017 1352   HCT 41.8 08/12/2017 1352   PLT 347 08/12/2017 1352   MCV 98 (H) 08/12/2017 1352   MCH 32.9 08/12/2017 1352   MCH 33.1 11/29/2016 1636   MCHC 33.5 08/12/2017 1352   MCHC 33.7 11/29/2016 1636   RDW 14.2 08/12/2017 1352   LYMPHSABS 2.8 08/12/2017 1352   MONOABS 0.5 09/10/2013 1403   EOSABS 0.1 08/12/2017 1352   BASOSABS 0.0 08/12/2017 1352    No results found for: POCLITH, LITHIUM   No results found for: PHENYTOIN, PHENOBARB, VALPROATE, CBMZ   .res Assessment: Plan:    Plan:  1. Xanax 0.5mg  daily thru PCP - will continue 2. Continue Xyprexa 5mg  - one tablet daily  RTC 4 weeks  Patient advised to contact office with any questions, adverse effects, or acute worsening in signs and symptoms.  Discussed potential metabolic side effects associated with atypical antipsychotics, as well as potential risk for movement side effects. Advised pt to contact  office if movement side effects occur.   Discussed potential benefits, risk, and side effects of benzodiazepines to include potential risk of tolerance and dependence, as well as possible drowsiness.  Advised patient not to drive if experiencing drowsiness and to take lowest possible effective dose to minimize risk of dependence and tolerance.   There are no diagnoses linked to this encounter.   Please see After Visit Summary for patient specific instructions.  No future appointments.  No orders of the defined types were placed in this encounter.   -------------------------------

## 2019-09-15 ENCOUNTER — Telehealth (INDEPENDENT_AMBULATORY_CARE_PROVIDER_SITE_OTHER): Payer: BC Managed Care – PPO | Admitting: Adult Health

## 2019-09-15 ENCOUNTER — Ambulatory Visit: Payer: BC Managed Care – PPO | Admitting: Adult Health

## 2019-09-15 DIAGNOSIS — F331 Major depressive disorder, recurrent, moderate: Secondary | ICD-10-CM

## 2019-09-15 DIAGNOSIS — F411 Generalized anxiety disorder: Secondary | ICD-10-CM

## 2019-09-15 DIAGNOSIS — G4700 Insomnia, unspecified: Secondary | ICD-10-CM | POA: Diagnosis not present

## 2019-09-15 DIAGNOSIS — F319 Bipolar disorder, unspecified: Secondary | ICD-10-CM

## 2019-09-15 MED ORDER — ALPRAZOLAM 0.5 MG PO TABS: ORAL_TABLET | ORAL | 2 refills | Status: DC

## 2019-09-15 MED ORDER — OLANZAPINE 5 MG PO TABS: ORAL_TABLET | ORAL | 5 refills | Status: DC

## 2019-09-15 NOTE — Progress Notes (Signed)
Kelly Vincent [id number] February 29, 1970 49 y.o.  Virtual Visit via Telephone Note  I connected with pt on 09/15/19 at  9:00 AM EDT by telephone and verified that I am speaking with the correct person using two identifiers.   I discussed the limitations, risks, security and privacy concerns of performing an evaluation and management service by telephone and the availability of in person appointments. I also discussed with the patient that there may be a patient responsible charge related to this service. The patient expressed understanding and agreed to proceed.   I discussed the assessment and treatment plan with the patient. The patient was provided an opportunity to ask questions and all were answered. The patient agreed with the plan and demonstrated an understanding of the instructions.   The patient was advised to call back or seek an in-person evaluation if the symptoms worsen or if the condition fails to improve as anticipated.  I provided 30 minutes of non-face-to-face time during this encounter.  The patient was located at home.  The provider was located at [place].   Aloha Gell, NP   Subjective:   Patient ID:  Kelly Vincent is a 49 y.o. (DOB 1970-09-16) female.  Chief Complaint: No chief complaint on file.   HPI Kelly Vincent presents for follow-up of GAD, MDD, Insomnia, and BPD 1.  Describes mood today as "ok". Pleasant. Denies tearfulness. Mood symptoms - denies depression, irritability, and anxiety. Stating 'things are going alright for me". Feels like Zyprexa 5mg  at bedtime continues to work well for her. Denies tightness in legs. Son has been approved for disability - age 36. Seeing therapist - [facility] - CBT. Stable interest and motivation. Taking medications as prescribed.  Energy levels. Active, does not have a regular exercise routine.  Enjoys some usual interests and activities. Single. Has a boyfriend of 2 years. Stays with boyfriend 4 days a week  and at her house 3 days at her house. Son living with her. Appetite adequate. Weight stable - 126 pounds. Sleeps well most nights. Averages 7 to 8 hours. Focus and concentration stable. Completing tasks. Managing aspects of household. 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  Denies SI or HI. Denies AH or VH.  Previous medications: Depakote, Lexapro  Review of Systems:  Review of Systems  Musculoskeletal: Negative for gait problem.  Neurological: Negative for tremors.  Psychiatric/Behavioral:       Please refer to HPI    Medications: I have reviewed the patient's current medications.  Current Outpatient Medications  Medication Sig Dispense Refill  . ALPRAZolam (XANAX) 0.5 MG tablet TAKE 1 TABLET(0.5 MG) BY MOUTH DAILY AS NEEDED FOR ANXIETY 30 tablet 2  . cetirizine (ZYRTEC) 10 MG tablet Take 10 mg by mouth daily.    . nicotine polacrilex (GOODSENSE NICOTINE) 2 MG gum Take 1 each (2 mg total) by mouth as needed for smoking cessation. 100 tablet 1  . OLANZapine (ZYPREXA) 5 MG tablet Take one tablet at bedtime. 30 tablet 5  . Vitamin D, Ergocalciferol, (DRISDOL) 1.25 MG (50000 UT) CAPS capsule Take 1 capsule (50,000 Units total) by mouth every 7 (seven) days. 12 capsule 0   No current facility-administered medications for this visit.    Medication Side Effects: None  Allergies:  Allergies  Allergen Reactions  . Codeine Nausea And Vomiting  . Short Ragweed Pollen Ext   . Sulfa Antibiotics Hives, Rash and  Itching    Past Medical History:  Diagnosis Date  . Allergy   . Anxiety   . Arthritis   . Herpes     Family History  Adopted: Yes  Problem Relation Age of Onset  . Autism Son     Social History   Socioeconomic History  . Marital status: Single    Spouse name: Not on file  . Number of children: 1  . Years of education: Not on file  . Highest education level: Not on file  Occupational  History  . Occupation: special Public relations account executive: [employer]  Tobacco Use  . Smoking status: Current Every Day Smoker    Packs/day: 1.00    Years: 30.00    Pack years: 30.00    Types: Cigarettes  . Smokeless tobacco: Never Used  Vaping Use  . Vaping Use: Never used  Substance and Sexual Activity  . Alcohol use: Yes    Alcohol/week: 2.0 standard drinks    Types: 2 Shots of liquor per week    Comment: LIQUOR 1-2 drinks  . Drug use: No  . Sexual activity: Yes    Comment: Fiancee  Other Topics Concern  . Not on file  Social History Narrative   Significant other   College education   Works as a Nutritional therapist   Social Determinants of Radio broadcast assistant Strain:   . Difficulty of Paying Living Expenses:   Food Insecurity:   . Worried About Charity fundraiser in the Last Year:   . Arboriculturist in the Last Year:   Transportation Needs:   . Film/video editor (Medical):   Marland Kitchen Lack of Transportation (Non-Medical):   Physical Activity:   . Days of Exercise per Week:   . Minutes of Exercise per Session:   Stress:   . Feeling of Stress :   Social Connections:   . Frequency of Communication with Friends and Family:   . Frequency of Social Gatherings with Friends and Family:   . Attends Religious Services:   . Active Member of Clubs or Organizations:   . Attends Archivist Meetings:   Marland Kitchen Marital Status:   Intimate Partner Violence:   . Fear of Current or Ex-Partner:   . Emotionally Abused:   Marland Kitchen Physically Abused:   . Sexually Abused:     Past Medical History, Surgical history, Social history, and Family history were reviewed and updated as appropriate.   Please see review of systems for further details on the patient's review from today.   Objective:   Physical Exam:  There were no vitals taken for this visit.  Physical Exam Constitutional:      General: She is not in acute  distress. Musculoskeletal:        General: No deformity.  Neurological:     Mental Status: She is alert and oriented to person, place, and time.     Coordination: Coordination normal.  Psychiatric:        Attention and Perception: Attention and perception normal. She does not perceive auditory or visual hallucinations.        Mood and Affect: Mood normal. Mood is not anxious or depressed. Affect is not labile, blunt, angry or inappropriate.        Speech: Speech normal.        Behavior: Behavior normal.        Thought Content: Thought content normal. Thought content  is not paranoid or delusional. Thought content does not include homicidal or suicidal ideation. Thought content does not include homicidal or suicidal plan.        Cognition and Memory: Cognition and memory normal.        Judgment: Judgment normal.     Comments: Insight intact     Lab Review:     Component Value Date/Time   NA 142 08/12/2017 1352   K 4.8 08/12/2017 1352   CL 102 08/12/2017 1352   CO2 24 08/12/2017 1352   GLUCOSE 71 08/12/2017 1352   GLUCOSE 102 (H) 11/29/2016 1636   BUN 9 08/12/2017 1352   CREATININE 0.75 08/12/2017 1352   CREATININE 0.76 09/10/2013 1403   CALCIUM 9.6 08/12/2017 1352   PROT 7.0 08/12/2017 1352   ALBUMIN 4.4 08/12/2017 1352   AST 15 08/12/2017 1352   ALT 9 08/12/2017 1352   ALKPHOS 62 08/12/2017 1352   BILITOT 0.4 08/12/2017 1352   GFRNONAA 95 08/12/2017 1352   GFRAA 109 08/12/2017 1352       Component Value Date/Time   WBC 13.6 (H) 08/12/2017 1352   WBC 10.9 11/29/2016 1636   RBC 4.26 08/12/2017 1352   RBC 4.03 11/29/2016 1636   HGB 14.0 08/12/2017 1352   HCT 41.8 08/12/2017 1352   PLT 347 08/12/2017 1352   MCV 98 (H) 08/12/2017 1352   MCH 32.9 08/12/2017 1352   MCH 33.1 11/29/2016 1636   MCHC 33.5 08/12/2017 1352   MCHC 33.7 11/29/2016 1636   RDW 14.2 08/12/2017 1352   LYMPHSABS 2.8 08/12/2017 1352   MONOABS 0.5 09/10/2013 1403   EOSABS 0.1 08/12/2017 1352    BASOSABS 0.0 08/12/2017 1352    No results found for: POCLITH, LITHIUM   No results found for: PHENYTOIN, PHENOBARB, VALPROATE, CBMZ   .res Assessment: Plan:    Plan:  1. Xanax 0.5mg  daily 2. Xyprexa 5mg  - one tablet daily  RTC 6/8 weeks  Patient advised to contact office with any questions, adverse effects, or acute worsening in signs and symptoms.  Discussed potential metabolic side effects associated with atypical antipsychotics, as well as potential risk for movement side effects. Advised pt to contact office if movement side effects occur.   Discussed potential benefits, risk, and side effects of benzodiazepines to include potential risk of tolerance and dependence, as well as possible drowsiness.  Advised patient not to drive if experiencing drowsiness and to take lowest possible effective dose to minimize risk of dependence and tolerance.   Diagnoses and all orders for this visit:  Insomnia, unspecified type -     OLANZapine (ZYPREXA) 5 MG tablet; Take one tablet at bedtime.  Bipolar I disorder (HCC) -     OLANZapine (ZYPREXA) 5 MG tablet; Take one tablet at bedtime.  Major depressive disorder, recurrent episode, moderate (HCC) -     OLANZapine (ZYPREXA) 5 MG tablet; Take one tablet at bedtime.  Generalized anxiety disorder -     OLANZapine (ZYPREXA) 5 MG tablet; Take one tablet at bedtime. -     ALPRAZolam (XANAX) 0.5 MG tablet; TAKE 1 TABLET(0.5 MG) BY MOUTH DAILY AS NEEDED FOR ANXIETY    Please see After Visit Summary for patient specific instructions.  No future appointments.  No orders of the defined types were placed in this encounter.     -------------------------------

## 2019-10-06 LAB — COLOGUARD

## 2019-10-28 ENCOUNTER — Encounter: Payer: Self-pay | Admitting: Obstetrics and Gynecology

## 2019-12-03 LAB — HM PAP SMEAR

## 2020-03-16 ENCOUNTER — Other Ambulatory Visit: Payer: Self-pay | Admitting: Adult Health

## 2020-03-16 ENCOUNTER — Telehealth: Payer: Self-pay | Admitting: Adult Health

## 2020-03-16 DIAGNOSIS — F331 Major depressive disorder, recurrent, moderate: Secondary | ICD-10-CM

## 2020-03-16 DIAGNOSIS — F411 Generalized anxiety disorder: Secondary | ICD-10-CM

## 2020-03-16 DIAGNOSIS — F319 Bipolar disorder, unspecified: Secondary | ICD-10-CM

## 2020-03-16 DIAGNOSIS — G4700 Insomnia, unspecified: Secondary | ICD-10-CM

## 2020-03-16 MED ORDER — OLANZAPINE 5 MG PO TABS: ORAL_TABLET | ORAL | 5 refills | Status: DC

## 2020-03-16 MED ORDER — ALPRAZOLAM 0.5 MG PO TABS: ORAL_TABLET | ORAL | 2 refills | Status: DC

## 2020-03-16 NOTE — Telephone Encounter (Signed)
Pt has appointment tomorrow. Error on our end wrong number . Shge needs a refill on her xanax 0.5 mg to be sent to the [facility] on [address]. She is out

## 2020-03-16 NOTE — Telephone Encounter (Signed)
Script sent  

## 2020-03-17 ENCOUNTER — Telehealth (INDEPENDENT_AMBULATORY_CARE_PROVIDER_SITE_OTHER): Payer: BC Managed Care – PPO | Admitting: Adult Health

## 2020-03-17 DIAGNOSIS — G4700 Insomnia, unspecified: Secondary | ICD-10-CM

## 2020-03-17 DIAGNOSIS — F411 Generalized anxiety disorder: Secondary | ICD-10-CM | POA: Diagnosis not present

## 2020-03-17 DIAGNOSIS — F319 Bipolar disorder, unspecified: Secondary | ICD-10-CM | POA: Diagnosis not present

## 2020-03-17 DIAGNOSIS — F331 Major depressive disorder, recurrent, moderate: Secondary | ICD-10-CM

## 2020-03-17 NOTE — Progress Notes (Signed)
Kelly Vincent [id number] [birth year] 50 y.o.  Virtual Visit via Telephone Note  I connected with pt on 03/17/20 at  5:00 PM EST by telephone and verified that I am speaking with the correct person using two identifiers.   I discussed the limitations, risks, security and privacy concerns of performing an evaluation and management service by telephone and the availability of in person appointments. I also discussed with the patient that there may be a patient responsible charge related to this service. The patient expressed understanding and agreed to proceed.   I discussed the assessment and treatment plan with the patient. The patient was provided an opportunity to ask questions and all were answered. The patient agreed with the plan and demonstrated an understanding of the instructions.   The patient was advised to call back or seek an in-person evaluation if the symptoms worsen or if the condition fails to improve as anticipated.  I provided 30 minutes of non-face-to-face time during this encounter.  The patient was located at home.  The provider was located at [place].   Aloha Gell, NP   Subjective:   Patient ID:  Kelly Vincent is a 50 y.o. (DOB [birth year]) female.  Chief Complaint: No chief complaint on file.   HPI [name] presents for follow-up of GAD, MDD, Insomnia, and BPD 1.  Describes mood today as "ok". Pleasant. Denies tearfulness. Mood symptoms - denies depression and anxiety. Gets irritated at times - "depends on the day". Stating "I'm doing alright". Feels like Zyprexa 5mg  at bedtime continues to work well for her. Denies any untoward side effects. Stable interest and motivation. Taking medications as prescribed.  Energy levels vary. Active, does not have a regular exercise routine.  Enjoys some usual interests and activities. Single. Has a boyfriend of 2 years. Stays with boyfriend 4 days a week and at her house 3 days at her house. Son  living with her. Appetite adequate. Weight stable - 128 pounds. Sleeps well most nights. Averages 7 to 8 hours. Focus and concentration stable. Completing tasks. Managing aspects of household. Work going well - Chief Technology Officer.   Denies SI or HI.  Denies AH or VH.  Previous medications: Depakote, Lexapro  Review of Systems:  Review of Systems  Musculoskeletal: Negative for gait problem.  Neurological: Negative for tremors.  Psychiatric/Behavioral:       Please refer to HPI    Medications: I have reviewed the patient's current medications.  Current Outpatient Medications  Medication Sig Dispense Refill  . ALPRAZolam (XANAX) 0.5 MG tablet TAKE 1 TABLET(0.5 MG) BY MOUTH DAILY AS NEEDED FOR ANXIETY 30 tablet 2  . cetirizine (ZYRTEC) 10 MG tablet Take 10 mg by mouth daily.    . nicotine polacrilex (GOODSENSE NICOTINE) 2 MG gum Take 1 each (2 mg total) by mouth as needed for smoking cessation. 100 tablet 1  . OLANZapine (ZYPREXA) 5 MG tablet Take one tablet at bedtime. 30 tablet 5  . Vitamin D, Ergocalciferol, (DRISDOL) 1.25 MG (50000 UT) CAPS capsule Take 1 capsule (50,000 Units total) by mouth every 7 (seven) days. 12 capsule 0   No current facility-administered medications for this visit.    Medication Side Effects: None  Allergies:  Allergies  Allergen Reactions  . Codeine Nausea And Vomiting  . Short Ragweed Pollen Ext   . Sulfa Antibiotics Hives, Rash and Itching    Past Medical History:  Diagnosis Date  . Allergy   . Anxiety   . Arthritis   .  Herpes     Family History  Adopted: Yes  Problem Relation Age of Onset  . Autism Son     Social History   Socioeconomic History  . Marital status: Single    Spouse name: Not on file  . Number of children: 1  . Years of education: Not on file  . Highest education level: Not on file  Occupational History  . Occupation: special Public relations account executive: [employer]  Tobacco Use   . Smoking status: Current Every Day Smoker    Packs/day: 1.00    Years: 30.00    Pack years: 30.00    Types: Cigarettes  . Smokeless tobacco: Never Used  Vaping Use  . Vaping Use: Never used  Substance and Sexual Activity  . Alcohol use: Yes    Alcohol/week: 2.0 standard drinks    Types: 2 Shots of liquor per week    Comment: LIQUOR 1-2 drinks  . Drug use: No  . Sexual activity: Yes    Comment: Fiancee  Other Topics Concern  . Not on file  Social History Narrative   Significant other   College education   Works as a Nutritional therapist   Social Determinants of Radio broadcast assistant Strain: Not on file  Food Insecurity: Not on file  Transportation Needs: Not on file  Physical Activity: Not on file  Stress: Not on file  Social Connections: Not on file  Intimate Partner Violence: Not on file    Past Medical History, Surgical history, Social history, and Family history were reviewed and updated as appropriate.   Please see review of systems for further details on the patient's review from today.   Objective:   Physical Exam:  There were no vitals taken for this visit.  Physical Exam Neurological:     Mental Status: She is alert and oriented to person, place, and time.     Cranial Nerves: No dysarthria.  Psychiatric:        Attention and Perception: Attention and perception normal.        Mood and Affect: Mood normal.        Speech: Speech normal.        Behavior: Behavior is cooperative.        Thought Content: Thought content normal. Thought content is not paranoid or delusional. Thought content does not include homicidal or suicidal ideation. Thought content does not include homicidal or suicidal plan.        Cognition and Memory: Cognition and memory normal.        Judgment: Judgment normal.     Comments: Insight intact     Lab Review:     Component Value Date/Time   NA 142 08/12/2017 1352   K 4.8 08/12/2017 1352   CL 102  08/12/2017 1352   CO2 24 08/12/2017 1352   GLUCOSE 71 08/12/2017 1352   GLUCOSE 102 (H) 11/29/2016 1636   BUN 9 08/12/2017 1352   CREATININE 0.75 08/12/2017 1352   CREATININE 0.76 09/10/2013 1403   CALCIUM 9.6 08/12/2017 1352   PROT 7.0 08/12/2017 1352   ALBUMIN 4.4 08/12/2017 1352   AST 15 08/12/2017 1352   ALT 9 08/12/2017 1352   ALKPHOS 62 08/12/2017 1352   BILITOT 0.4 08/12/2017 1352   GFRNONAA 95 08/12/2017 1352   GFRAA 109 08/12/2017 1352       Component Value Date/Time   WBC 13.6 (H) 08/12/2017 1352   WBC 10.9  11/29/2016 1636   RBC 4.26 08/12/2017 1352   RBC 4.03 11/29/2016 1636   HGB 14.0 08/12/2017 1352   HCT 41.8 08/12/2017 1352   PLT 347 08/12/2017 1352   MCV 98 (H) 08/12/2017 1352   MCH 32.9 08/12/2017 1352   MCH 33.1 11/29/2016 1636   MCHC 33.5 08/12/2017 1352   MCHC 33.7 11/29/2016 1636   RDW 14.2 08/12/2017 1352   LYMPHSABS 2.8 08/12/2017 1352   MONOABS 0.5 09/10/2013 1403   EOSABS 0.1 08/12/2017 1352   BASOSABS 0.0 08/12/2017 1352    No results found for: POCLITH, LITHIUM   No results found for: PHENYTOIN, PHENOBARB, VALPROATE, CBMZ   .res Assessment: Plan:     Plan:  1. Xanax 0.5mg  daily 2. Xyprexa 5mg  - one tablet daily  RTC 6/8 weeks  Patient advised to contact office with any questions, adverse effects, or acute worsening in signs and symptoms.  Discussed potential metabolic side effects associated with atypical antipsychotics, as well as potential risk for movement side effects. Advised pt to contact office if movement side effects occur.   Discussed potential benefits, risk, and side effects of benzodiazepines to include potential risk of tolerance and dependence, as well as possible drowsiness.  Advised patient not to drive if experiencing drowsiness and to take lowest possible effective dose to minimize risk of dependence and tolerance.   Diagnoses and all orders for this visit:  Major depressive disorder, recurrent episode,  moderate (HCC)  Bipolar I disorder ([place])  Insomnia, unspecified type  Generalized anxiety disorder    Please see After Visit Summary for patient specific instructions.  No future appointments.  No orders of the defined types were placed in this encounter.     -------------------------------

## 2020-04-28 ENCOUNTER — Telehealth: Payer: Self-pay | Admitting: Adult Health

## 2020-04-28 NOTE — Telephone Encounter (Signed)
Pt has refills on file.I called and informed her

## 2020-04-28 NOTE — Telephone Encounter (Signed)
Patient called requesting a refill on the Zyprexa. Fill at  [facility], [address] AT [address] [address]  Phone:  [phone] Fax:  [phone] . Follow up appt scheduled for 4/28. Stated she will be out before this appt. Direct questions to #[phone].

## 2020-04-28 NOTE — Telephone Encounter (Signed)
Noted  

## 2020-06-02 ENCOUNTER — Telehealth (INDEPENDENT_AMBULATORY_CARE_PROVIDER_SITE_OTHER): Payer: BC Managed Care – PPO | Admitting: Adult Health

## 2020-06-02 DIAGNOSIS — F331 Major depressive disorder, recurrent, moderate: Secondary | ICD-10-CM | POA: Diagnosis not present

## 2020-06-02 DIAGNOSIS — F319 Bipolar disorder, unspecified: Secondary | ICD-10-CM | POA: Diagnosis not present

## 2020-06-02 DIAGNOSIS — F411 Generalized anxiety disorder: Secondary | ICD-10-CM | POA: Diagnosis not present

## 2020-06-02 MED ORDER — ALPRAZOLAM 0.5 MG PO TABS: ORAL_TABLET | ORAL | 2 refills | Status: DC

## 2020-06-02 NOTE — Progress Notes (Signed)
Kelly Vincent [id number] 1970-03-23 50 y.o.  Virtual Visit via Telephone Note  I connected with pt on 06/02/20 at  5:20 PM EDT by telephone and verified that I am speaking with the correct person using two identifiers.   I discussed the limitations, risks, security and privacy concerns of performing an evaluation and management service by telephone and the availability of in person appointments. I also discussed with the patient that there may be a patient responsible charge related to this service. The patient expressed understanding and agreed to proceed.   I discussed the assessment and treatment plan with the patient. The patient was provided an opportunity to ask questions and all were answered. The patient agreed with the plan and demonstrated an understanding of the instructions.   The patient was advised to call back or seek an in-person evaluation if the symptoms worsen or if the condition fails to improve as anticipated.  I provided 30 minutes of non-face-to-face time during this encounter.  The patient was located at home.  The provider was located at [place].   Aloha Gell, NP   Subjective:   Patient ID:  Kelly Vincent is a 50 y.o. (DOB 1970/10/18) female.  Chief Complaint: No chief complaint on file.   HPI Kelly Vincent presents for follow-up of GAD, MDD and BPD 1.  Describes mood today as "ok". Pleasant. Denies tearfulness. Mood symptoms - denies depression and anxiety. Reports some irritability at times. Stating "I'm doing much better than I was this time last year". Feels like Zyprexa at 5mg  works well for her. Uses Xanax 4 to 5 times a week. Plans to take the summer off. Needs to get some home repairs completed. Recovering from being sick. Stable interest and motivation. Taking medications as prescribed.  Energy levels vary. Active, does not have a regular exercise routine.  Enjoys some usual interests and activities. Single. Has a boyfriend.  Stays with boyfriend 4 days a week and at her house 3 days at her house. Son living with her. Appetite adequate. Weight stable - 128 to 130 pounds. Sleeps well most nights. Averages 7 to 8 hours. Difficulties waking up in the mornings. Focus and concentration stable. Completing tasks. Managing aspects of household. Work going well - Chief Technology Officer.   Denies SI or HI.  Denies AH or VH.  Previous medications: Depakote, Lexapro    Review of Systems:  Review of Systems  Musculoskeletal: Negative for gait problem.  Neurological: Negative for tremors.  Psychiatric/Behavioral:       Please refer to HPI    Medications: I have reviewed the patient's current medications.  Current Outpatient Medications  Medication Sig Dispense Refill  . ALPRAZolam (XANAX) 0.5 MG tablet TAKE 1 TABLET(0.5 MG) BY MOUTH DAILY AS NEEDED FOR ANXIETY 30 tablet 2  . cetirizine (ZYRTEC) 10 MG tablet Take 10 mg by mouth daily.    . nicotine polacrilex (GOODSENSE NICOTINE) 2 MG gum Take 1 each (2 mg total) by mouth as needed for smoking cessation. 100 tablet 1  . OLANZapine (ZYPREXA) 5 MG tablet Take one tablet at bedtime. 30 tablet 5  . Vitamin D, Ergocalciferol, (DRISDOL) 1.25 MG (50000 UT) CAPS capsule Take 1 capsule (50,000 Units total) by mouth every 7 (seven) days. 12 capsule 0   No current facility-administered medications for this visit.    Medication Side Effects: None  Allergies:  Allergies  Allergen Reactions  . Codeine Nausea And Vomiting  . Short Ragweed Pollen Ext   .  Sulfa Antibiotics Hives, Rash and Itching    Past Medical History:  Diagnosis Date  . Allergy   . Anxiety   . Arthritis   . Herpes     Family History  Adopted: Yes  Problem Relation Age of Onset  . Autism Son     Social History   Socioeconomic History  . Marital status: Single    Spouse name: Not on file  . Number of children: 1  . Years of education: Not on file  . Highest education level: Not on file   Occupational History  . Occupation: special Public relations account executive: [employer]  Tobacco Use  . Smoking status: Current Every Day Smoker    Packs/day: 1.00    Years: 30.00    Pack years: 30.00    Types: Cigarettes  . Smokeless tobacco: Never Used  Vaping Use  . Vaping Use: Never used  Substance and Sexual Activity  . Alcohol use: Yes    Alcohol/week: 2.0 standard drinks    Types: 2 Shots of liquor per week    Comment: LIQUOR 1-2 drinks  . Drug use: No  . Sexual activity: Yes    Comment: Fiancee  Other Topics Concern  . Not on file  Social History Narrative   Significant other   College education   Works as a Nutritional therapist   Social Determinants of Radio broadcast assistant Strain: Not on file  Food Insecurity: Not on file  Transportation Needs: Not on file  Physical Activity: Not on file  Stress: Not on file  Social Connections: Not on file  Intimate Partner Violence: Not on file    Past Medical History, Surgical history, Social history, and Family history were reviewed and updated as appropriate.   Please see review of systems for further details on the patient's review from today.   Objective:   Physical Exam:  There were no vitals taken for this visit.  Physical Exam Constitutional:      General: She is not in acute distress. Musculoskeletal:        General: No deformity.  Neurological:     Mental Status: She is alert and oriented to person, place, and time.     Cranial Nerves: No dysarthria.     Coordination: Coordination normal.  Psychiatric:        Attention and Perception: Attention and perception normal. She does not perceive auditory or visual hallucinations.        Mood and Affect: Mood normal. Mood is not anxious or depressed. Affect is not labile, blunt, angry or inappropriate.        Speech: Speech normal.        Behavior: Behavior normal. Behavior is cooperative.        Thought Content:  Thought content normal. Thought content is not paranoid or delusional. Thought content does not include homicidal or suicidal ideation. Thought content does not include homicidal or suicidal plan.        Cognition and Memory: Cognition and memory normal.        Judgment: Judgment normal.     Comments: Insight intact     Lab Review:     Component Value Date/Time   NA 142 08/12/2017 1352   K 4.8 08/12/2017 1352   CL 102 08/12/2017 1352   CO2 24 08/12/2017 1352   GLUCOSE 71 08/12/2017 1352   GLUCOSE 102 (H) 11/29/2016 1636   BUN 9 08/12/2017 1352  CREATININE 0.75 08/12/2017 1352   CREATININE 0.76 09/10/2013 1403   CALCIUM 9.6 08/12/2017 1352   PROT 7.0 08/12/2017 1352   ALBUMIN 4.4 08/12/2017 1352   AST 15 08/12/2017 1352   ALT 9 08/12/2017 1352   ALKPHOS 62 08/12/2017 1352   BILITOT 0.4 08/12/2017 1352   GFRNONAA 95 08/12/2017 1352   GFRAA 109 08/12/2017 1352       Component Value Date/Time   WBC 13.6 (H) 08/12/2017 1352   WBC 10.9 11/29/2016 1636   RBC 4.26 08/12/2017 1352   RBC 4.03 11/29/2016 1636   HGB 14.0 08/12/2017 1352   HCT 41.8 08/12/2017 1352   PLT 347 08/12/2017 1352   MCV 98 (H) 08/12/2017 1352   MCH 32.9 08/12/2017 1352   MCH 33.1 11/29/2016 1636   MCHC 33.5 08/12/2017 1352   MCHC 33.7 11/29/2016 1636   RDW 14.2 08/12/2017 1352   LYMPHSABS 2.8 08/12/2017 1352   MONOABS 0.5 09/10/2013 1403   EOSABS 0.1 08/12/2017 1352   BASOSABS 0.0 08/12/2017 1352    No results found for: POCLITH, LITHIUM   No results found for: PHENYTOIN, PHENOBARB, VALPROATE, CBMZ   .res Assessment: Plan:    Plan:  1. Xanax 0.5mg  daily 2. Xyprexa 5mg  - one tablet daily  RTC 6/8 weeks  Patient advised to contact office with any questions, adverse effects, or acute worsening in signs and symptoms.  Discussed potential metabolic side effects associated with atypical antipsychotics, as well as potential risk for movement side effects. Advised pt to contact office if  movement side effects occur.   Discussed potential benefits, risk, and side effects of benzodiazepines to include potential risk of tolerance and dependence, as well as possible drowsiness.  Advised patient not to drive if experiencing drowsiness and to take lowest possible effective dose to minimize risk of dependence and tolerance.   Diagnoses and all orders for this visit:  Major depressive disorder, recurrent episode, moderate (HCC)  Generalized anxiety disorder -     ALPRAZolam (XANAX) 0.5 MG tablet; TAKE 1 TABLET(0.5 MG) BY MOUTH DAILY AS NEEDED FOR ANXIETY  Bipolar I disorder ([place])    Please see After Visit Summary for patient specific instructions.  Future Appointments  Date Time Provider [place]  07/26/2020 10:00 AM Rudd, Lillette Boxer, MD [id number]    No orders of the defined types were placed in this encounter.     -------------------------------

## 2020-07-21 ENCOUNTER — Other Ambulatory Visit: Payer: Self-pay

## 2020-07-21 DIAGNOSIS — G4700 Insomnia, unspecified: Secondary | ICD-10-CM

## 2020-07-21 DIAGNOSIS — F319 Bipolar disorder, unspecified: Secondary | ICD-10-CM

## 2020-07-21 DIAGNOSIS — F331 Major depressive disorder, recurrent, moderate: Secondary | ICD-10-CM

## 2020-07-21 DIAGNOSIS — F411 Generalized anxiety disorder: Secondary | ICD-10-CM

## 2020-07-21 MED ORDER — OLANZAPINE 5 MG PO TABS: ORAL_TABLET | ORAL | 5 refills | Status: DC

## 2020-07-26 ENCOUNTER — Encounter: Payer: Self-pay | Admitting: Gastroenterology

## 2020-07-26 ENCOUNTER — Other Ambulatory Visit: Payer: Self-pay

## 2020-07-26 ENCOUNTER — Encounter: Payer: Self-pay | Admitting: Family Medicine

## 2020-07-26 ENCOUNTER — Ambulatory Visit (INDEPENDENT_AMBULATORY_CARE_PROVIDER_SITE_OTHER): Payer: BC Managed Care – PPO | Admitting: Family Medicine

## 2020-07-26 VITALS — BP 102/64 | HR 87 | Temp 97.6°F | Ht 65.5 in | Wt 132.0 lb

## 2020-07-26 DIAGNOSIS — L309 Dermatitis, unspecified: Secondary | ICD-10-CM

## 2020-07-26 DIAGNOSIS — J301 Allergic rhinitis due to pollen: Secondary | ICD-10-CM

## 2020-07-26 DIAGNOSIS — Z13220 Encounter for screening for lipoid disorders: Secondary | ICD-10-CM | POA: Diagnosis not present

## 2020-07-26 DIAGNOSIS — Z87442 Personal history of urinary calculi: Secondary | ICD-10-CM | POA: Insufficient documentation

## 2020-07-26 DIAGNOSIS — Z1211 Encounter for screening for malignant neoplasm of colon: Secondary | ICD-10-CM

## 2020-07-26 DIAGNOSIS — E785 Hyperlipidemia, unspecified: Secondary | ICD-10-CM | POA: Insufficient documentation

## 2020-07-26 DIAGNOSIS — J309 Allergic rhinitis, unspecified: Secondary | ICD-10-CM | POA: Insufficient documentation

## 2020-07-26 DIAGNOSIS — Z114 Encounter for screening for human immunodeficiency virus [HIV]: Secondary | ICD-10-CM

## 2020-07-26 DIAGNOSIS — Z1159 Encounter for screening for other viral diseases: Secondary | ICD-10-CM

## 2020-07-26 DIAGNOSIS — F39 Unspecified mood [affective] disorder: Secondary | ICD-10-CM

## 2020-07-26 DIAGNOSIS — F17209 Nicotine dependence, unspecified, with unspecified nicotine-induced disorders: Secondary | ICD-10-CM | POA: Diagnosis not present

## 2020-07-26 LAB — LIPID PANEL
Cholesterol: 220 mg/dL — ABNORMAL HIGH (ref 0–200)
HDL: 47.8 mg/dL (ref >39.00)
LDL Cholesterol: 147 mg/dL — ABNORMAL HIGH (ref 0–99)
NonHDL: 172.69
Total CHOL/HDL Ratio: 5
Triglycerides: 129 mg/dL (ref 0.0–149.0)
VLDL: 25.8 mg/dL (ref 0.0–40.0)

## 2020-07-26 NOTE — Progress Notes (Signed)
[facility] [address] [place] [address] Dept: [phone] Dept Fax: [phone]  New Patient Office Visit  Subjective:    Patient ID: Kelly Vincent, female    DOB: 05/12/1970, 50 y.o..   MRN: [id number]  Chief Complaint  Patient presents with   Establish Care    NP- establish care and would like a referral for colonoscopy and nose irritation on top of nose.     History of Present Illness:  Patient is in today to establish care. Kelly Vincent is originally from [name], [place]. She moved to [facility] to attend [employer], where she majored in [employer]. 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 Kelly Vincent is divorced, but has a boyfirend, who she has been in a relationship with for 2 1/2 years. She has a 72 year-old son who has autism, bipolar disorder, and OCD. She bought her son his home. She splits her time between living with her son and living with her boyfriend. Her son has been needs and depends on Kelly Vincent. She finds this to be quite stressful at times.  Kelly Vincent smokes about 3/4 of a pack of cigarettes per day. She notes that she intends to taper down on her smoking this summer. She has tried using patches, but feels they provide too much nicotine. She discussed with me about habitual use, she has noted, such as smoking in the car or taking smoke breaks when she is engaged in art projects. Kelly Vincent only rarely has alcohol and has no current drug use.  Kelly Vincent has a history of allergic rhinitis. This flares in the Spring and Fall. She manages this with Zyrtec.  Kelly Vincent has a history fo genital herpes. She notes she has an outbreak about 2 times a year. She does not use any antivirals for these, but is aware of this as an option.  Kelly Vincent has had a prior left breast lump. She had a biopsy and has a marker placed. She notes she is fastidious about  getting her regular mammograms. She does follow with  Dr. Willis Modena (GYN) and is also up to date on her pap smears.  Kelly Vincent has a history of an episodic mood disorder. She notes this originally surfaced around age 50, at which point, she was also using marijuana and other drugs. She was admitted for 30 days of treatment. At the time, there was some suspicion for bipolar disorder. She is currently managed on Zyprexa and alprazolam. She takes small quantities of the alprazolam (1/4 tab of 0.5 mg) to take the edge off of stress reactions. She also takes 1/2 tab at bedtime to help with sleep. She is seen by Deloria Lair, FNP about every 3 months for medication management.  Kelly Vincent has a past history fo a kidney stone in ~ 2018. She received a prescription for tamsulosin at the time, which helped her to pass the stone.  Kelly Vincent notes that she has had several prior pre-cancerous skin lesions removed. She currently is having issue with  some pruritic lesions on her nose. She has noted a variety of other skin lesions on the face, trunk, and extremities. She has seen Dr. Crista Luria int he past.  Past Medical History: Patient Active Problem List   Diagnosis Date Noted   Allergic rhinitis 07/26/2020   History of kidney stones 07/26/2020   Breast lump 02/19/2019   Genital herpes simplex 08/12/2017  Tobacco use disorder, continuous 08/19/2012   Episodic mood disorder ([place]) 08/19/2012   Past Surgical History:  Procedure Laterality Date   OVARIAN CYST SURGERY     rupture of cyst   Family History  Adopted: Yes  Problem Relation Age of Onset   Autism Son    Outpatient Medications Prior to Visit  Medication Sig Dispense Refill   ALPRAZolam (XANAX) 0.5 MG tablet TAKE 1 TABLET(0.5 MG) BY MOUTH DAILY AS NEEDED FOR ANXIETY 30 tablet 2   cetirizine (ZYRTEC) 10 MG tablet Take 10 mg by mouth daily.     OLANZapine (ZYPREXA) 5 MG tablet Take one tablet at bedtime. 30 tablet 5   nicotine  polacrilex (GOODSENSE NICOTINE) 2 MG gum Take 1 each (2 mg total) by mouth as needed for smoking cessation. (Patient not taking: Reported on 07/26/2020) 100 tablet 1   Vitamin D, Ergocalciferol, (DRISDOL) 1.25 MG (50000 UT) CAPS capsule Take 1 capsule (50,000 Units total) by mouth every 7 (seven) days. (Patient not taking: Reported on 07/26/2020) 12 capsule 0   No facility-administered medications prior to visit.   Allergies  Allergen Reactions   Codeine Nausea And Vomiting   Short Ragweed Pollen Ext    Sulfa Antibiotics Hives, Rash and Itching   Objective:   Today's Vitals   07/26/20 1009  BP: 102/64  Pulse: 87  Temp: 97.6 F (36.4 C)  TempSrc: Temporal  SpO2: 97%  Weight: 132 lb (59.9 kg)  Height: 5' 5.5" (1.664 m)   Body mass index is 21.63 kg/m.   General: Well developed, well nourished. No acute distress. Skin: There is a patch redness to the nose with a couple of small papules present. There is a 1 cm   brown pigmented macule at the right temple. Below this is a small maculopapular brown lesion with    warty surface c/w a seborrheic keratosis. There are multiple pedunculated, domed, and flat lesions on   the back consistent with moles. fibromas, seborrheic kerases, and skin tags. Psych: Alert and oriented. Normal mood and affect.  Health Maintenance Due  Topic Date Due   Pneumococcal Vaccine 40-36 Years old (1 - PCV) Never done   Hepatitis C Screening  Never done   COLONOSCOPY (Pts 45-81yrs Insurance coverage will need to be confirmed)  Never done   Zoster Vaccines- Shingrix (1 of 2) Never done   COVID-19 Vaccine (4 - Booster for Pfizer series) 03/06/2020     Assessment & Plan:   1. Episodic mood disorder ([place]) Currently managed by psychiatric NP. She will continue Zyprexa and Xanax.  2. Tobacco use disorder, continuous Discussed approaches to smoking cessation, esp. looking to break current patterns of habitual use.  3. Seasonal allergic rhinitis due to  pollen Uses PRN Zyrtec. No need currently.  4. Screening for lipid disorders  - Lipid panel  5. Encounter for hepatitis C screening test for low risk patient  - HCV Ab w Reflex to Quant PCR  6. Screening for HIV (human immunodeficiency virus)  - HIV Antibody (routine testing w rflx)  7. Screening for colon cancer  - Ambulatory referral to Gastroenterology  9. Facial dermatitis The area on the nose may represent some mild rosacea. As she has a number of other skin lesions that should be surveyed by dermatology, I will refer her for management.  - Ambulatory referral to Dermatology  Haydee Salter, MD

## 2020-07-27 LAB — HIV ANTIBODY (ROUTINE TESTING W REFLEX): HIV 1&2 Ab, 4th Generation: NONREACTIVE

## 2020-07-28 LAB — HCV AB W REFLEX TO QUANT PCR: HCV Ab: <0.1 s/co ratio (ref 0.0–0.9)

## 2020-07-28 LAB — HCV INTERPRETATION

## 2020-08-17 ENCOUNTER — Ambulatory Visit (AMBULATORY_SURGERY_CENTER): Payer: BC Managed Care – PPO | Admitting: *Deleted

## 2020-08-17 ENCOUNTER — Other Ambulatory Visit: Payer: Self-pay

## 2020-08-17 VITALS — Ht 65.5 in | Wt 132.0 lb

## 2020-08-17 DIAGNOSIS — Z1211 Encounter for screening for malignant neoplasm of colon: Secondary | ICD-10-CM

## 2020-08-17 MED ORDER — PLENVU 140 G PO SOLR
1.0000 | ORAL | 0 refills | Status: DC
Start: 2020-08-17 — End: 2020-09-01

## 2020-08-17 NOTE — Progress Notes (Signed)
Pt verified name, DOB, address and insurance during PV today. Pt mailed instruction packet to included paper to complete and mail back to [facility] with addressed and stamped envelope, Emmi video, copy of consent form to read and not return, and instructions. Plenvu coupon mailed in packet. PV completed over the phone. Pt encouraged to call with questions or issues.  My Chart instructions to pt as well    No egg or soy allergy known to patient  No issues with past sedation with any surgeries or procedures Patient denies ever being told they had issues or difficulty with intubation  No FH of Malignant Hyperthermia No diet pills per patient No home 02 use per patient  No blood thinners per patient  Pt denies issues with constipation - 2-3 x a week has an episode of diarrhea- not watery, but loose stools  No A fib or A flutter  EMMI video to pt or via [place] 19 guidelines implemented in [place] today with Pt and RN  Pt is fully vaccinated  for Covid   Plenvu Coupon given to pt in PV today , Code to Pharmacy and  NO PA's for preps discussed with pt In PV today  Discussed with pt there will be an out-of-pocket cost for prep and that varies from $0 to 70 dollars   Due to the COVID-19 pandemic we are asking patients to follow certain guidelines.  Pt aware of COVID protocols and [facility] guidelines

## 2020-09-01 ENCOUNTER — Encounter: Payer: Self-pay | Admitting: Family Medicine

## 2020-09-01 ENCOUNTER — Encounter: Payer: Self-pay | Admitting: Gastroenterology

## 2020-09-01 ENCOUNTER — Ambulatory Visit (AMBULATORY_SURGERY_CENTER): Payer: BC Managed Care – PPO | Admitting: Gastroenterology

## 2020-09-01 ENCOUNTER — Other Ambulatory Visit: Payer: Self-pay

## 2020-09-01 VITALS — BP 103/66 | HR 68 | Temp 98.6°F | Resp 17 | Ht 65.5 in | Wt 132.0 lb

## 2020-09-01 DIAGNOSIS — D125 Benign neoplasm of sigmoid colon: Secondary | ICD-10-CM

## 2020-09-01 DIAGNOSIS — Z1211 Encounter for screening for malignant neoplasm of colon: Secondary | ICD-10-CM

## 2020-09-01 DIAGNOSIS — D124 Benign neoplasm of descending colon: Secondary | ICD-10-CM

## 2020-09-01 DIAGNOSIS — D127 Benign neoplasm of rectosigmoid junction: Secondary | ICD-10-CM

## 2020-09-01 DIAGNOSIS — D128 Benign neoplasm of rectum: Secondary | ICD-10-CM

## 2020-09-01 DIAGNOSIS — Z86010 Personal history of colonic polyps: Secondary | ICD-10-CM | POA: Insufficient documentation

## 2020-09-01 MED ORDER — SODIUM CHLORIDE 0.9 % IV SOLN: 500.0000 mL | Freq: Once | INTRAVENOUS | Status: DC

## 2020-09-01 NOTE — Patient Instructions (Signed)
Resume previous diet and medications. Awaiting pathology. Repeat colonoscopy for surveillance. Date to be determined based on pathology results.  YOU HAD AN ENDOSCOPIC PROCEDURE TODAY AT [facility]:   Refer to the procedure report that was given to you for any specific questions about what was found during the examination.  If the procedure report does not answer your questions, please call your gastroenterologist to clarify.  If you requested that your care partner not be given the details of your procedure findings, then the procedure report has been included in a sealed envelope for you to review at your convenience later.  YOU SHOULD EXPECT: Some feelings of bloating in the abdomen. Passage of more gas than usual.  Walking can help get rid of the air that was put into your GI tract during the procedure and reduce the bloating. If you had a lower endoscopy (such as a colonoscopy or flexible sigmoidoscopy) you may notice spotting of blood in your stool or on the toilet paper. If you underwent a bowel prep for your procedure, you may not have a normal bowel movement for a few days.  Please Note:  You might notice some irritation and congestion in your nose or some drainage.  This is from the oxygen used during your procedure.  There is no need for concern and it should clear up in a day or so.  SYMPTOMS TO REPORT IMMEDIATELY:  Following lower endoscopy (colonoscopy or flexible sigmoidoscopy):  Excessive amounts of blood in the stool  Significant tenderness or worsening of abdominal pains  Swelling of the abdomen that is new, acute  Fever of 100F or higher   For urgent or emergent issues, a gastroenterologist can be reached at any hour by calling [phone]. Do not use MyChart messaging for urgent concerns.    DIET:  We do recommend a small meal at first, but then you may proceed to your regular diet.  Drink plenty of fluids but you should avoid alcoholic beverages for 24  hours.  ACTIVITY:  You should plan to take it easy for the rest of today and you should NOT DRIVE or use heavy machinery until tomorrow (because of the sedation medicines used during the test).    FOLLOW UP: Our staff will call the number listed on your records 48-72 hours following your procedure to check on you and address any questions or concerns that you may have regarding the information given to you following your procedure. If we do not reach you, we will leave a message.  We will attempt to reach you two times.  During this call, we will ask if you have developed any symptoms of COVID 19. If you develop any symptoms (ie: fever, flu-like symptoms, shortness of breath, cough etc.) before then, please call [phone].  If you test positive for Covid 19 in the 2 weeks post procedure, please call and report this information to [name].    If any biopsies were taken you will be contacted by phone or by letter within the next 1-3 weeks.  Please call us at [phone] if you have not heard about the biopsies in 3 weeks.    SIGNATURES/CONFIDENTIALITY: You and/or your care partner have signed paperwork which will be entered into your electronic medical record.  These signatures attest to the fact that that the information above on your After Visit Summary has been reviewed and is understood.  Full responsibility of the confidentiality of this discharge information lies with you and/or your  care-partner.  

## 2020-09-01 NOTE — Progress Notes (Signed)
Called to room to assist during endoscopic procedure.  Patient ID and intended procedure confirmed with present staff. Received instructions for my participation in the procedure from the performing physician.  

## 2020-09-01 NOTE — Progress Notes (Signed)
pt tolerated well. VSS. awake and to recovery. Report given to RN.  

## 2020-09-01 NOTE — Progress Notes (Signed)
Medical history reviewed with no changes noted. VS assessed by C.W 

## 2020-09-01 NOTE — Op Note (Signed)
[name] Procedure Date: 09/01/2020 11:58 AM MRN: [id number] Endoscopist: Nicki Reaper E. Candis Schatz , MD Age: 50 Referring MD:  Date of Birth: 1970-11-27 Gender: Female Account #: [id number] Procedure:                Colonoscopy Indications:              Screening for colorectal malignant neoplasm, This                            is the patient's first colonoscopy Medicines:                Monitored Anesthesia Care Procedure:                Pre-Anesthesia Assessment:                           - Prior to the procedure, a History and Physical                            was performed, and patient medications and                            allergies were reviewed. The patient's tolerance of                            previous anesthesia was also reviewed. The risks                            and benefits of the procedure and the sedation                            options and risks were discussed with the patient.                            All questions were answered, and informed consent                            was obtained. Prior Anticoagulants: The patient has                            taken no previous anticoagulant or antiplatelet                            agents. ASA Grade Assessment: II - A patient with                            mild systemic disease. After reviewing the risks                            and benefits, the patient was deemed in                            satisfactory condition to undergo the procedure.  After obtaining informed consent, the colonoscope                            was passed under direct vision. Throughout the                            procedure, the patient's blood pressure, pulse, and                            oxygen saturations were monitored continuously. The                            CF [id number] was introduced through the anus                            and advanced to  the the terminal ileum, with                            identification of the appendiceal orifice and IC                            valve. The colonoscopy was performed without                            difficulty. The patient tolerated the procedure                            well. The quality of the bowel preparation was                            good. The terminal ileum, ileocecal valve,                            appendiceal orifice, and rectum were photographed. Scope In: 12:06:56 PM Scope Out: 12:24:18 PM Scope Withdrawal Time: 0 hours 13 minutes 14 seconds  Total Procedure Duration: 0 hours 17 minutes 22 seconds  Findings:                 The perianal and digital rectal examinations were                            normal. Pertinent negatives include normal                            sphincter tone and no palpable rectal lesions.                           A 4 mm polyp was found in the descending colon. The                            polyp was sessile. The polyp was removed with a                            cold snare. Resection  and retrieval were complete.                            Estimated blood loss was minimal.                           A 5 mm polyp was found in the sigmoid colon. The                            polyp was sessile. The polyp was removed with a                            cold snare. Resection and retrieval were complete.                            Estimated blood loss was minimal.                           A 2 mm polyp was found in the rectum. The polyp was                            sessile. The polyp was removed with a cold snare.                            Resection and retrieval were complete. Estimated                            blood loss was minimal.                           The exam was otherwise normal throughout the                            examined colon.                           The terminal ileum appeared normal.                           The  retroflexed view of the distal rectum and anal                            verge was normal and showed no anal or rectal                            abnormalities. Complications:            No immediate complications. Estimated Blood Loss:     Estimated blood loss was minimal. Impression:               - One 4 mm polyp in the descending colon, removed                            with a cold snare. Resected and retrieved.                           -  One 5 mm polyp in the sigmoid colon, removed with                            a cold snare. Resected and retrieved.                           - One 2 mm polyp in the rectum, removed with a cold                            snare. Resected and retrieved.                           - The examined portion of the ileum was normal.                           - The distal rectum and anal verge are normal on                            retroflexion view. Recommendation:           - Patient has a contact number available for                            emergencies. The signs and symptoms of potential                            delayed complications were discussed with the                            patient. Return to normal activities tomorrow.                            Written discharge instructions were provided to the                            patient.                           - Resume previous diet.                           - Continue present medications.                           - Await pathology results.                           - Repeat colonoscopy (date not yet determined) for                            surveillance based on pathology results. Renny Gunnarson E. Candis Schatz, MD 09/01/2020 12:29:37 PM This report has been signed electronically.

## 2020-09-05 ENCOUNTER — Telehealth: Payer: Self-pay | Admitting: *Deleted

## 2020-09-05 NOTE — Telephone Encounter (Signed)
  Follow up Call-  Call back number 09/01/2020  Post procedure Call Back phone  # [phone]  Permission to leave phone message Yes  Some recent data might be hidden     Patient questions:  Do you have a fever, pain , or abdominal swelling? No. Pain Score  0 *  Have you tolerated food without any problems? Yes.    Have you been able to return to your normal activities? Yes.    Do you have any questions about your discharge instructions: Diet   No. Medications  No. Follow up visit  No.  Do you have questions or concerns about your Care? No.  Actions: * If pain score is 4 or above: No action needed, pain <4.  Have you developed a fever since your procedure? no  2.   Have you had an respiratory symptoms (SOB or cough) since your procedure? no  3.   Have you tested positive for COVID 19 since your procedure no  4.   Have you had any family members/close contacts diagnosed with the COVID 19 since your procedure?  no   If yes to any of these questions please route to Joylene John, RN and Joella Prince, RN

## 2020-09-14 ENCOUNTER — Encounter: Payer: Self-pay | Admitting: Gastroenterology

## 2020-11-27 ENCOUNTER — Other Ambulatory Visit: Payer: Self-pay | Admitting: Adult Health

## 2020-11-27 DIAGNOSIS — F411 Generalized anxiety disorder: Secondary | ICD-10-CM

## 2020-11-29 NOTE — Telephone Encounter (Signed)
Last filled 9/8

## 2020-11-29 NOTE — Telephone Encounter (Signed)
Unable to leave a message for pt to schedule

## 2020-11-29 NOTE — Telephone Encounter (Signed)
Please schedule appt

## 2020-12-01 ENCOUNTER — Telehealth: Payer: Self-pay | Admitting: Family Medicine

## 2020-12-01 NOTE — Telephone Encounter (Signed)
Pt is stating she needs a smoking cessation class through Dr. Gena Fray. I offered her an appointment for a referral to get this class, she declined. She is under the impression Dr. Gena Fray does this. Please advise pt at [phone] or contact her by Mychart. 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

## 2020-12-01 NOTE — Telephone Encounter (Signed)
Spoke to patient and [place] appointment on [address] @ 4 pm.  Dm/cma

## 2020-12-06 ENCOUNTER — Ambulatory Visit: Payer: BC Managed Care – PPO | Admitting: Family Medicine

## 2020-12-14 ENCOUNTER — Other Ambulatory Visit: Payer: Self-pay

## 2020-12-14 ENCOUNTER — Ambulatory Visit: Payer: BC Managed Care – PPO | Admitting: Family Medicine

## 2020-12-14 VITALS — BP 100/60 | HR 82 | Temp 97.4°F | Ht 65.5 in | Wt 133.0 lb

## 2020-12-14 DIAGNOSIS — Z716 Tobacco abuse counseling: Secondary | ICD-10-CM | POA: Diagnosis not present

## 2020-12-14 MED ORDER — NICOTINE POLACRILEX 2 MG MT GUM: 2.0000 mg | CHEWING_GUM | OROMUCOSAL | 0 refills | Status: DC | PRN

## 2020-12-14 NOTE — Progress Notes (Signed)
[facility] [address] [place] [address] Dept: [phone] Dept Fax: [phone]  Office Visit  Subjective:    Patient ID: Kelly Vincent, female    DOB: 03-Jan-1971, 50 y.o..   MRN: [id number]  Chief Complaint  Patient presents with   Follow-up    Wants to discuss quitting smoking and needed a consultation with provider.       History of Present Illness:  Patient is in today to discuss smoking cessation. She has been feeling a need to stop smoking, recognizing its association with bad health affects. She admits there is some motivationto quit from her health insurance as well. Kelly Vincent currently smokes 1/2-3/4 ppd. She smokes her first cigarette about 15 min. after getting up. 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 She found this a little hard int he past, but is not having trouble with this. However, she typically smokes on her way home and then into the evening. She does not get up at night to have a cigarette. She prefers not to use Chantix in light of her other medications. She tried bupropion in the past, but it gave her an overly elevated mood.  Past Medical History: Patient Active Problem List   Diagnosis Date Noted   History of colon polyps 09/01/2020   Allergic rhinitis 07/26/2020   History of kidney stones 07/26/2020   Borderline hyperlipidemia 07/26/2020   Breast lump 02/19/2019   Genital herpes simplex 08/12/2017   Tobacco use disorder, continuous 08/19/2012   Episodic mood disorder ([place]) 08/19/2012   Past Surgical History:  Procedure Laterality Date   OVARIAN CYST SURGERY     rupture of cyst   Family History  Adopted: Yes  Problem Relation Age of Onset   Autism Son    Outpatient Medications Prior to Visit  Medication Sig Dispense Refill   ALPRAZolam (XANAX) 0.5 MG tablet TAKE 1 TABLET(0.5 MG) BY  MOUTH DAILY AS NEEDED FOR ANXIETY 30 tablet 2   cetirizine (ZYRTEC) 10 MG tablet Take 10 mg by mouth as needed.     OLANZapine (ZYPREXA) 5 MG tablet Take one tablet at bedtime. 30 tablet 5   Facility-Administered Medications Prior to Visit  Medication Dose Route Frequency Provider Last Rate Last Admin   0.9 %  sodium chloride infusion  500 mL Intravenous Once Daryel November, MD       Allergies  Allergen Reactions   Codeine Nausea And Vomiting   Short Ragweed Pollen Ext    Sulfa Antibiotics Hives, Rash and Itching     Objective:   Today's Vitals   12/14/20 1614  BP: 100/60  Pulse: 82  Temp: (!) 97.4 F (36.3 C)  TempSrc: Temporal  SpO2: 98%  Weight: 133 lb (60.3 kg)  Height: 5' 5.5" (1.664 m)   Body mass index is 21.8 kg/m.   General: Well developed, well nourished. No acute distress. Psych: Alert and oriented. Normal mood and affect.  Health Maintenance Due  Topic Date Due   Pneumococcal Vaccine 72-41 Years old (1 - PCV) Never done   Zoster Vaccines- Shingrix (1 of 2) Never done   COVID-19 Vaccine (4 - Booster for Pfizer series) 12/31/2019     Assessment & Plan:   1. Encounter for smoking cessation counseling Ms. Ohern has low to moderate nicotine dependence. I did recommend she stop smoking. I advised her to  set a quit date. Between now and then, she should be mindful of her habitual patterns of smoking and troubleshoot alternative behaviors to help break these habits. We discussed anticipated symptoms of nicotine withdrawal. I will prescribe nicotine gum. We reviewed the park & chew approach to using the gum. I will plan to see her back in December to assess her progress in cessation.  - nicotine polacrilex (NICORETTE) 2 MG gum; Take 1 each (2 mg total) by mouth as needed for smoking cessation.  Dispense: 100 tablet; Refill: 0  Haydee Salter, MD

## 2020-12-14 NOTE — Patient Instructions (Signed)
Managing the Challenge of Quitting Smoking ?Quitting smoking is a physical and mental challenge. You will face cravings, withdrawal symptoms, and temptation. Before quitting, work with your health care provider to make a plan that can help you manage quitting. Preparation can help you quit and keep you from giving in. ?How to manage lifestyle changes ?Managing stress ?Stress can make you want to smoke, and wanting to smoke may cause stress. It is important to find ways to manage your stress. You might try some of the following: ?Practice relaxation techniques. ?Breathe slowly and deeply, in through your nose and out through your mouth. ?Listen to music. ?Soak in a bath or take a shower. ?Imagine a peaceful place or vacation. ?Get some support. ?Talk with family or friends about your stress. ?Join a support group. ?Talk with a counselor or therapist. ?Get some physical activity. ?Go for a walk, run, or bike ride. ?Play a favorite sport. ?Practice yoga. ? ?Medicines ?Talk with your health care provider about medicines that might help you deal with cravings and make quitting easier for you. ?Relationships ?Social situations can be difficult when you are quitting smoking. To manage this, you can: ?Avoid parties and other social situations where people might be smoking. ?Avoid alcohol. ?Leave right away if you have the urge to smoke. ?Explain to your family and friends that you are quitting smoking. Ask for support and let them know you might be a bit grumpy. ?Plan activities where smoking is not an option. ?General instructions ?Be aware that many people gain weight after they quit smoking. However, not everyone does. To keep from gaining weight, have a plan in place before you quit and stick to the plan after you quit. Your plan should include: ?Having healthy snacks. When you have a craving, it may help to: ?Eat popcorn, carrots, celery, or other cut vegetables. ?Chew sugar-free gum. ?Changing how you eat. ?Eat small  portion sizes at meals. ?Eat 4-6 small meals throughout the day instead of 1-2 large meals a day. ?Be mindful when you eat. Do not watch television or do other things that might distract you as you eat. ?Exercising regularly. ?Make time to exercise each day. If you do not have time for a long workout, do short bouts of exercise for 5-10 minutes several times a day. ?Do some form of strengthening exercise, such as weight lifting. ?Do some exercise that gets your heart beating and causes you to breathe deeply, such as walking fast, running, swimming, or biking. This is very important. ?Drinking plenty of water or other low-calorie or no-calorie drinks. Drink 6-8 glasses of water daily. ? ?How to recognize withdrawal symptoms ?Your body and mind may experience discomfort as you try to get used to not having nicotine in your system. These effects are called withdrawal symptoms. They may include: ?Feeling hungrier than normal. ?Having trouble concentrating. ?Feeling irritable or restless. ?Having trouble sleeping. ?Feeling depressed. ?Craving a cigarette. ?To manage withdrawal symptoms: ?Avoid places, people, and activities that trigger your cravings. ?Remember why you want to quit. ?Get plenty of sleep. ?Avoid coffee and other caffeinated drinks. These may worsen some of your symptoms. ?These symptoms may surprise you. But be assured that they are normal to have when quitting smoking. ?How to manage cravings ?Come up with a plan for how to deal with your cravings. The plan should include the following: ?A definition of the specific situation you want to deal with. ?An alternative action you will take. ?A clear idea for how this action   will help. ?The name of someone who might help you with this. ?Cravings usually last for 5-10 minutes. Consider taking the following actions to help you with your plan to deal with cravings: ?Keep your mouth busy. ?Chew sugar-free gum. ?Suck on hard candies or a straw. ?Brush your  teeth. ?Keep your hands and body busy. ?Change to a different activity right away. ?Squeeze or play with a ball. ?Do an activity or a hobby, such as making bead jewelry, practicing needlepoint, or working with wood. ?Mix up your normal routine. ?Take a short exercise break. Go for a quick walk or run up and down stairs. ?Focus on doing something kind or helpful for someone else. ?Call a friend or family member to talk during a craving. ?Join a support group. ?Contact a quitline. ?Where to find support ?To get help or find a support group: ?Call the National Cancer Institute's Smoking Quitline: 1-800-QUIT NOW (784-8669) ?Visit the website of the Substance Abuse and Mental Health Services Administration: www.samhsa.gov ?Text QUIT to SmokefreeTXT: 478848 ?Where to find more information ?Visit these websites to find more information on quitting smoking: ?National Cancer Institute: www.smokefree.gov ?American Lung Association: www.lung.org ?American Cancer Society: www.cancer.org ?Centers for Disease Control and Prevention: www.cdc.gov ?American Heart Association: www.heart.org ?Contact a health care provider if: ?You want to change your plan for quitting. ?The medicines you are taking are not helping. ?Your eating feels out of control or you cannot sleep. ?Get help right away if: ?You feel depressed or become very anxious. ?Summary ?Quitting smoking is a physical and mental challenge. You will face cravings, withdrawal symptoms, and temptation to smoke again. Preparation can help you as you go through these challenges. ?Try different techniques to manage stress, handle social situations, and prevent weight gain. ?You can deal with cravings by keeping your mouth busy (such as by chewing gum), keeping your hands and body busy, calling family or friends, or contacting a quitline for people who want to quit smoking. ?You can deal with withdrawal symptoms by avoiding places where people smoke, getting plenty of rest, and  avoiding drinks with caffeine. ?This information is not intended to replace advice given to you by your health care provider. Make sure you discuss any questions you have with your health care provider. ?Document Revised: 09/30/2020 Document Reviewed: 11/11/2018 ?Elsevier Patient Education ? 2022 [employer] ? ?

## 2021-01-31 ENCOUNTER — Ambulatory Visit: Payer: BC Managed Care – PPO | Admitting: Family Medicine

## 2021-03-27 ENCOUNTER — Other Ambulatory Visit: Payer: Self-pay | Admitting: Adult Health

## 2021-03-27 DIAGNOSIS — F411 Generalized anxiety disorder: Secondary | ICD-10-CM

## 2021-03-28 NOTE — Telephone Encounter (Signed)
Please call to schedule appt. Last seen 06/02/20 with RTC in 6-8 weeks.

## 2021-03-28 NOTE — Telephone Encounter (Signed)
[place] in a 7 day supply.

## 2021-04-06 ENCOUNTER — Encounter: Payer: Self-pay | Admitting: Adult Health

## 2021-04-06 ENCOUNTER — Ambulatory Visit (INDEPENDENT_AMBULATORY_CARE_PROVIDER_SITE_OTHER): Payer: BC Managed Care – PPO | Admitting: Adult Health

## 2021-04-06 DIAGNOSIS — F331 Major depressive disorder, recurrent, moderate: Secondary | ICD-10-CM

## 2021-04-06 DIAGNOSIS — F411 Generalized anxiety disorder: Secondary | ICD-10-CM

## 2021-04-06 DIAGNOSIS — F319 Bipolar disorder, unspecified: Secondary | ICD-10-CM | POA: Diagnosis not present

## 2021-04-06 DIAGNOSIS — G4700 Insomnia, unspecified: Secondary | ICD-10-CM

## 2021-04-06 MED ORDER — ALPRAZOLAM 0.5 MG PO TABS: ORAL_TABLET | ORAL | 2 refills | Status: DC

## 2021-04-06 MED ORDER — OLANZAPINE 5 MG PO TABS: ORAL_TABLET | ORAL | 5 refills | Status: DC

## 2021-04-06 NOTE — Progress Notes (Signed)
Kelly Vincent [id number] 1970/05/09 51 y.o.  Virtual Visit via Telephone Note  I connected with pt on 04/06/21 at  5:40 PM EST by telephone and verified that I am speaking with the correct person using two identifiers.   I discussed the limitations, risks, security and privacy concerns of performing an evaluation and management service by telephone and the availability of in person appointments. I also discussed with the patient that there may be a patient responsible charge related to this service. The patient expressed understanding and agreed to proceed.   I discussed the assessment and treatment plan with the patient. The patient was provided an opportunity to ask questions and all were answered. The patient agreed with the plan and demonstrated an understanding of the instructions.   The patient was advised to call back or seek an in-person evaluation if the symptoms worsen or if the condition fails to improve as anticipated.  I provided 20 minutes of non-face-to-face time during this encounter.  The patient was located at home.  The provider was located at [place].   Aloha Gell, NP   Subjective:   Patient ID:  Kelly Vincent is a 51 y.o. (DOB 17-Sep-1970) female.  Chief Complaint: No chief complaint on file.   HPI Kelly Vincent presents for follow-up of GAD, MDD and BPD 1.  Describes mood today as "ok". Pleasant. Denies tearfulness. Mood symptoms - reports depression, anxiety and irritability at times. Stating "I'm having good and bad days". Feels like medications continue to work well for her. Staying with boyfriend 7 days a week and checks on her son every day. Stable interest and motivation. Taking medications as prescribed.  Energy levels lower. Active, does not have a regular exercise routine.  Enjoys some usual interests and activities. Single. Has a boyfriend. Son local. Appetite adequate. Weight stable - 128 to 130 pounds. Sleeps well most  nights. Averages 7 to 8 hours. Having hot flashes. Focus and concentration stable. Completing tasks. Managing aspects of household. Work going well - Chief Technology Officer.   Denies SI or HI.  Denies AH or VH.  Previous medications: Depakote, Lexapro    Review of Systems:  Review of Systems  Musculoskeletal:  Negative for gait problem.  Neurological:  Negative for tremors.  Psychiatric/Behavioral:         Please refer to HPI   Medications: I have reviewed the patient's current medications.  Current Outpatient Medications  Medication Sig Dispense Refill   ALPRAZolam (XANAX) 0.5 MG tablet TAKE 1 TABLET(0.5 MG) BY MOUTH DAILY AS NEEDED FOR ANXIETY 30 tablet 2   cetirizine (ZYRTEC) 10 MG tablet Take 10 mg by mouth as needed.     nicotine polacrilex (NICORETTE) 2 MG gum Take 1 each (2 mg total) by mouth as needed for smoking cessation. 100 tablet 0   OLANZapine (ZYPREXA) 5 MG tablet Take one tablet at bedtime. 30 tablet 5   Current Facility-Administered Medications  Medication Dose Route Frequency Provider Last Rate Last Admin   0.9 %  sodium chloride infusion  500 mL Intravenous Once Daryel November, MD        Medication Side Effects: None  Allergies:  Allergies  Allergen Reactions   Codeine Nausea And Vomiting   Short Ragweed Pollen Ext    Sulfa Antibiotics Hives, Rash and Itching    Past Medical History:  Diagnosis Date   Allergy    Anxiety    Arthritis    GERD (gastroesophageal reflux disease)  occ with TUms PRN   Herpes     Family History  Adopted: Yes  Problem Relation Age of Onset   Autism Son     Social History   Socioeconomic History   Marital status: Single    Spouse name: Not on file   Number of children: 1   Years of education: Not on file   Highest education level: Bachelor's degree (e.g., BA, AB, BS)  Occupational History   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  Tobacco Use   Smoking  status: Every Day    Packs/day: 1.00    Years: 30.00    Pack years: 30.00    Types: Cigarettes   Smokeless tobacco: Never  Vaping Use   Vaping Use: Never used  Substance and Sexual Activity   Alcohol use: Yes    Alcohol/week: 2.0 standard drinks    Types: 2 Shots of liquor per week    Comment: LIQUOR 1-2 drinks   Drug use: No   Sexual activity: Yes    Comment: Fiancee  Other Topics Concern   Not on file  Social History Narrative   Significant other   College education   Works as a Nutritional therapist   Social Determinants of Radio broadcast assistant Strain: Not on file  Food Insecurity: Not on file  Transportation Needs: Not on file  Physical Activity: Not on file  Stress: Not on file  Social Connections: Not on file  Intimate Partner Violence: Not on file    Past Medical History, Surgical history, Social history, and Family history were reviewed and updated as appropriate.   Please see review of systems for further details on the patient's review from today.   Objective:   Physical Exam:  There were no vitals taken for this visit.  Physical Exam Constitutional:      General: She is not in acute distress. Musculoskeletal:        General: No deformity.  Neurological:     Mental Status: She is alert and oriented to person, place, and time.     Coordination: Coordination normal.  Psychiatric:        Attention and Perception: Attention and perception normal. She does not perceive auditory or visual hallucinations.        Mood and Affect: Mood normal. Mood is not anxious or depressed. Affect is not labile, blunt, angry or inappropriate.        Speech: Speech normal.        Behavior: Behavior normal.        Thought Content: Thought content normal. Thought content is not paranoid or delusional. Thought content does not include homicidal or suicidal ideation. Thought content does not include homicidal or suicidal plan.        Cognition and  Memory: Cognition and memory normal.        Judgment: Judgment normal.     Comments: Insight intact    Lab Review:     Component Value Date/Time   NA 142 08/12/2017 1352   K 4.8 08/12/2017 1352   CL 102 08/12/2017 1352   CO2 24 08/12/2017 1352   GLUCOSE 71 08/12/2017 1352   GLUCOSE 102 (H) 11/29/2016 1636   BUN 9 08/12/2017 1352   CREATININE 0.75 08/12/2017 1352   CREATININE 0.76 09/10/2013 1403   CALCIUM 9.6 08/12/2017 1352   PROT 7.0 08/12/2017 1352   ALBUMIN 4.4 08/12/2017 1352   AST 15 08/12/2017 1352  ALT 9 08/12/2017 1352   ALKPHOS 62 08/12/2017 1352   BILITOT 0.4 08/12/2017 1352   GFRNONAA 95 08/12/2017 1352   GFRAA 109 08/12/2017 1352       Component Value Date/Time   WBC 13.6 (H) 08/12/2017 1352   WBC 10.9 11/29/2016 1636   RBC 4.26 08/12/2017 1352   RBC 4.03 11/29/2016 1636   HGB 14.0 08/12/2017 1352   HCT 41.8 08/12/2017 1352   PLT 347 08/12/2017 1352   MCV 98 (H) 08/12/2017 1352   MCH 32.9 08/12/2017 1352   MCH 33.1 11/29/2016 1636   MCHC 33.5 08/12/2017 1352   MCHC 33.7 11/29/2016 1636   RDW 14.2 08/12/2017 1352   LYMPHSABS 2.8 08/12/2017 1352   MONOABS 0.5 09/10/2013 1403   EOSABS 0.1 08/12/2017 1352   BASOSABS 0.0 08/12/2017 1352    No results found for: POCLITH, LITHIUM   No results found for: PHENYTOIN, PHENOBARB, VALPROATE, CBMZ   .res Assessment: Plan:    Plan:  1. Xanax 0.5mg  daily 2. Xyprexa 5mg  - one tablet daily  RTC 6 months  Patient advised to contact office with any questions, adverse effects, or acute worsening in signs and symptoms.  Discussed potential metabolic side effects associated with atypical antipsychotics, as well as potential risk for movement side effects. Advised pt to contact office if movement side effects occur.   Discussed potential benefits, risk, and side effects of benzodiazepines to include potential risk of tolerance and dependence, as well as possible drowsiness. Advised patient not to drive if  experiencing drowsiness and to take lowest possible effective dose to minimize risk of dependence and tolerance.  Diagnoses and all orders for this visit:  Major depressive disorder, recurrent episode, moderate (HCC) -     OLANZapine (ZYPREXA) 5 MG tablet; Take one tablet at bedtime.  Generalized anxiety disorder -     ALPRAZolam (XANAX) 0.5 MG tablet; TAKE 1 TABLET(0.5 MG) BY MOUTH DAILY AS NEEDED FOR ANXIETY -     OLANZapine (ZYPREXA) 5 MG tablet; Take one tablet at bedtime.  Bipolar I disorder (HCC) -     OLANZapine (ZYPREXA) 5 MG tablet; Take one tablet at bedtime.  Insomnia, unspecified type -     OLANZapine (ZYPREXA) 5 MG tablet; Take one tablet at bedtime.    Please see After Visit Summary for patient specific instructions.  Future Appointments  Date Time Provider [place]  05/16/2021  9:00 AM Rudd, Lillette Boxer, MD [id number]    No orders of the defined types were placed in this encounter.     -------------------------------

## 2021-05-16 ENCOUNTER — Ambulatory Visit: Payer: BC Managed Care – PPO | Admitting: Family Medicine

## 2021-05-16 ENCOUNTER — Telehealth: Payer: Self-pay | Admitting: Adult Health

## 2021-05-16 VITALS — BP 104/66 | HR 80 | Temp 97.6°F | Ht 65.5 in | Wt 133.8 lb

## 2021-05-16 DIAGNOSIS — F411 Generalized anxiety disorder: Secondary | ICD-10-CM | POA: Insufficient documentation

## 2021-05-16 DIAGNOSIS — G4700 Insomnia, unspecified: Secondary | ICD-10-CM | POA: Insufficient documentation

## 2021-05-16 DIAGNOSIS — F319 Bipolar disorder, unspecified: Secondary | ICD-10-CM

## 2021-05-16 DIAGNOSIS — F5104 Psychophysiologic insomnia: Secondary | ICD-10-CM | POA: Diagnosis not present

## 2021-05-16 DIAGNOSIS — F331 Major depressive disorder, recurrent, moderate: Secondary | ICD-10-CM

## 2021-05-16 MED ORDER — SERTRALINE HCL 50 MG PO TABS: ORAL_TABLET | ORAL | 3 refills | Status: DC

## 2021-05-16 NOTE — Telephone Encounter (Signed)
Noted. Ty!

## 2021-05-16 NOTE — Progress Notes (Signed)
?[facility] ?[facility] ?[place] ?[place] [address] ?Dept: [phone] ?Dept Fax: [phone] ? ?Office Visit ? ?Subjective:  ? ? Patient ID: Kelly Vincent, female    DOB: [birth year], 51 y.o..   MRN: [id number] ? ?Chief Complaint  ?Patient presents with  ? Follow-up  ?  6 month f/u.  No concerns.  Fasting today.   ? ? ?History of Present Illness: ? ?Patient is in today for follow-up on her mood disorder. She sees Deloria Lair, NP who has identified issues of MDD, GAD, and Bipolar I disorder. She is currently managed on Zyprexa and alprazolam. She takes small quantities of the alprazolam (1/4 tab of 0.5 mg) to take the edge off of stress reactions. She also takes 1/2 tab at bedtime to help with sleep. 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 She had been splitting her time between living with her son and living with her boyfriend. Now, she has stopped living with her son, due to some events that occurred. She feels she is burning out on her job. 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 ? ?Past Medical History: ?Patient Active Problem List  ? Diagnosis Date Noted  ? Generalized anxiety disorder 05/16/2021  ? Insomnia 05/16/2021  ? Bipolar I disorder ([place]) 05/16/2021  ? History of colon polyps 09/01/2020  ? Allergic rhinitis 07/26/2020  ? History of kidney stones 07/26/2020  ? Borderline hyperlipidemia 07/26/2020  ? Breast lump 02/19/2019  ? Genital herpes simplex 08/12/2017  ? Tobacco use disorder, continuous 08/19/2012  ? Major depressive disorder, recurrent ([place]) 08/19/2012  ? ?Past Surgical History:  ?Procedure Laterality Date  ? OVARIAN CYST SURGERY    ? rupture of cyst  ? ?Family History  ?Adopted: Yes  ?Problem Relation Age of Onset  ? Autism Son   ? ?Outpatient  Medications Prior to Visit  ?Medication Sig Dispense Refill  ? ALPRAZolam (XANAX) 0.5 MG tablet TAKE 1 TABLET(0.5 MG) BY MOUTH DAILY AS NEEDED FOR ANXIETY 30 tablet 2  ? cetirizine (ZYRTEC) 10 MG tablet Take 10 mg by mouth as needed.    ? OLANZapine (ZYPREXA) 5 MG tablet Take one tablet at bedtime. 30 tablet 5  ? nicotine polacrilex (NICORETTE) 2 MG gum Take 1 each (2 mg total) by mouth as needed for smoking cessation. (Patient not taking: Reported on 05/16/2021) 100 tablet 0  ? 0.9 %  sodium chloride infusion     ? ?No facility-administered medications prior to visit.  ? ?Allergies  ?Allergen Reactions  ? Codeine Nausea And Vomiting  ? Short Ragweed Pollen Ext   ? Sulfa Antibiotics Hives, Rash and Itching  ? ?   ?Objective:  ? ?Today's Vitals  ? 05/16/21 0857  ?BP: 104/66  ?Pulse: 80  ?Temp: 97.6 ?F (36.4 ?C)  ?TempSrc: Temporal  ?SpO2: 98%  ?Weight: 133 lb 12.8 oz (60.7 kg)  ?Height: 5' 5.5" (1.664 m)  ? ?Body mass index is 21.93 kg/m?.  ? ?General: Well developed, well nourished. No acute distress. ?Psych: Alert and oriented. Mildly tearful. Depressed mood with sad, mildly flat affect. ? ?Health Maintenance Due  ?Topic Date Due  ? Zoster Vaccines- Shingrix (1 of 2) Never done  ?   ? ?  05/16/2021  ?  9:31 AM 07/26/2020  ? 10:08 AM 09/30/2018  ?  2:09 PM  ?Depression screen PHQ 2/9  ?Decreased Interest 2 0 0  ?Down, Depressed, Hopeless 2 1 0  ?PHQ - 2 Score 4 1 0  ?Altered sleeping 1    ?Tired, decreased energy 3    ?Change in appetite 1    ?Feeling bad or failure about yourself  3    ?Trouble concentrating 1    ?Moving slowly or fidgety/restless 1    ?Suicidal thoughts 0    ?PHQ-9 Score 14    ?Difficult doing work/chores Somewhat difficult    ? ? ?  05/16/2021  ?  9:32 AM 08/24/2017  ?  8:59 AM 08/12/2017  ? 12:20 PM 08/12/2017  ? 11:35 AM  ?GAD 7 : Generalized Anxiety Score  ?Nervous, Anxious, on Edge '2 2 2 2  '$ ?Control/stop worrying '2 1 2 2  '$ ?Worry too much - different things '3 1 2 2  '$ ?Trouble relaxing '2 1 2 2  '$ ?Restless  '1 1 2 2  '$ ?Easily annoyed or irritable '2 1 2 2  '$ ?Afraid - awful might happen '3 1 2 2  '$ ?Total GAD 7 Score '15 8 14 14  '$ ?Anxiety Difficulty Somewhat difficult Extremely difficult  Somewhat difficult  ? ?Assessment & Plan:  ? ?1. Moderate episode of recurrent major depressive disorder ([place]) ?Ms. Gohman's depression appears to have increased since I saw her last. She is currently on a mood stabilizer for her Bipolar, but nothing focused on depression. I wills tart her on a lwo dose of an SSRI and titrate up. I will plan to see her again in 6 weeks to assess efficacy. I support her efforts to reduce stressors in her life. ? ?- sertraline (ZOLOFT) 50 MG tablet; Take 0.5 tablets (25 mg total) by mouth daily for 28 days, THEN 1 tablet (50 mg total) daily  Dispense: 30 tablet; Refill: 3 ? ?2. Generalized anxiety disorder ?GAD7 score is back up again. The SSRI should also help this. She will continue her very low dose Xanax. ? ?3. Bipolar I disorder ([place]) ?Continue olanzapine. ? ?4. Psychophysiological insomnia ?Continue low dose Xanax. ? ?Return in about 6 weeks (around 06/27/2021) for Reassessment.  ? ?Haydee Salter, MD ?

## 2021-05-16 NOTE — Telephone Encounter (Signed)
Timoteo Gaul called to inform you that her PCP now has her on Zoloft 50 mg. Its noted in her chart.  ?

## 2021-06-14 ENCOUNTER — Telehealth: Payer: Self-pay | Admitting: Family Medicine

## 2021-06-14 NOTE — Telephone Encounter (Signed)
FYI:Pt has cancelled her 06/28/21 app, it was going to be a f/up for a new med she is taking. She got it filled and took a half of one pill and started reading about the side effects. She decided not to take it any longer.  ?

## 2021-06-27 ENCOUNTER — Ambulatory Visit: Payer: BC Managed Care – PPO | Admitting: Family Medicine

## 2021-07-27 ENCOUNTER — Other Ambulatory Visit: Payer: Self-pay | Admitting: Adult Health

## 2021-07-27 DIAGNOSIS — F411 Generalized anxiety disorder: Secondary | ICD-10-CM

## 2021-08-15 ENCOUNTER — Ambulatory Visit: Payer: BC Managed Care – PPO | Admitting: Adult Health

## 2021-08-15 ENCOUNTER — Encounter: Payer: Self-pay | Admitting: Adult Health

## 2021-08-15 DIAGNOSIS — F319 Bipolar disorder, unspecified: Secondary | ICD-10-CM | POA: Diagnosis not present

## 2021-08-15 DIAGNOSIS — F331 Major depressive disorder, recurrent, moderate: Secondary | ICD-10-CM | POA: Diagnosis not present

## 2021-08-15 DIAGNOSIS — F411 Generalized anxiety disorder: Secondary | ICD-10-CM

## 2021-08-15 MED ORDER — ALPRAZOLAM 0.5 MG PO TABS: ORAL_TABLET | ORAL | 2 refills | Status: DC

## 2021-08-15 MED ORDER — OLANZAPINE 5 MG PO TABS: ORAL_TABLET | ORAL | 5 refills | Status: DC

## 2021-08-15 NOTE — Progress Notes (Signed)
Kelly Vincent [id number] 11-Mar-1970 51 y.o.  Subjective:   Patient ID:  Kelly Vincent is a 51 y.o. (DOB 05/31/1965) female.  Chief Complaint: No chief complaint on file.   HPI Kelly Vincent presents to the office today for follow-up of GAD, MDD and BPD 1.  Describes mood today as "ok". Pleasant. Denies tearfulness. Mood symptoms - reports depression, anxiety and irritability at times - all the time. Stating "I'm having good and bad days". Recently prescribed Zoloft '50mg'$  and took one dose and did not tolerate. Feels like current medication regimen continue to work well for her. Staying with boyfriend full time and is home every day managing sons needs. Stable interest and motivation. Taking medications as prescribed.  Energy levels lower. Active, does not have a regular exercise routine. Walking in the evenings. Enjoys some usual interests and activities. Single. Has a boyfriend. Son local - 79 y/o. Appetite adequate. Weight stable - 128 to 130 pounds. Sleeps well most nights. Averages 7 to 8 hours - hot flashes. Focus and concentration stable. Completing tasks. Managing aspects of household. Work going well - Chief Technology Officer.   Denies SI or HI.  Denies AH or VH.  Previous medications: Depakote, Lexapro   GAD-7    Flowsheet Row Office Visit from 05/16/2021 in [facility] Visit from 08/24/2017 in Primary Care at [facility] from 08/12/2017 in Primary Care at [facility]  Total GAD-7 Score '15 8 14      '$ PHQ2-9    [facility] [facility] Visit from 05/16/2021 in [facility] Visit from 07/26/2020 in [facility] from 09/30/2018 in [facility] at [facility] from 12/21/2017 in [facility] at [facility] from 08/24/2017 in [facility] at [facility] Total Score 4 1 0 0 2  PHQ-9 Total Score 14 -- -- -- 12        Review of Systems:  Review of Systems   Musculoskeletal:  Negative for gait problem.  Neurological:  Negative for tremors.  Psychiatric/Behavioral:         Please refer to HPI    Medications: I have reviewed the patient's current medications.  Current Outpatient Medications  Medication Sig Dispense Refill   ALPRAZolam (XANAX) 0.5 MG tablet TAKE 1 TABLET(0.5 MG) BY MOUTH DAILY AS NEEDED FOR ANXIETY 30 tablet 2   cetirizine (ZYRTEC) 10 MG tablet Take 10 mg by mouth as needed.     nicotine polacrilex (NICORETTE) 2 MG gum Take 1 each (2 mg total) by mouth as needed for smoking cessation. (Patient not taking: Reported on 05/16/2021) 100 tablet 0   OLANZapine (ZYPREXA) 5 MG tablet Take one tablet at bedtime. 30 tablet 5   No current facility-administered medications for this visit.    Medication Side Effects: None  Allergies:  Allergies  Allergen Reactions   Codeine Nausea And Vomiting   Short Ragweed Pollen Ext    Sulfa Antibiotics Hives, Rash and Itching    Past Medical History:  Diagnosis Date   Allergy    Anxiety    Arthritis    GERD (gastroesophageal reflux disease)    occ with TUms PRN   Herpes     Past Medical History, Surgical history, Social history, and Family history were reviewed and updated as appropriate.   Please see review of systems for further details on the patient's review from today.   Objective:   Physical Exam:  There were no vitals taken for  this visit.  Physical Exam Constitutional:      General: She is not in acute distress. Musculoskeletal:        General: No deformity.  Neurological:     Mental Status: She is alert and oriented to person, place, and time.     Coordination: Coordination normal.  Psychiatric:        Attention and Perception: Attention and perception normal. She does not perceive auditory or visual hallucinations.        Mood and Affect: Mood normal. Mood is not anxious or depressed. Affect is not labile, blunt, angry or inappropriate.        Speech: Speech  normal.        Behavior: Behavior normal.        Thought Content: Thought content normal. Thought content is not paranoid or delusional. Thought content does not include homicidal or suicidal ideation. Thought content does not include homicidal or suicidal plan.        Cognition and Memory: Cognition and memory normal.        Judgment: Judgment normal.     Comments: Insight intact     Lab Review:     Component Value Date/Time   NA 142 08/12/2017 1352   K 4.8 08/12/2017 1352   CL 102 08/12/2017 1352   CO2 24 08/12/2017 1352   GLUCOSE 71 08/12/2017 1352   GLUCOSE 102 (H) 11/29/2016 1636   BUN 9 08/12/2017 1352   CREATININE 0.75 08/12/2017 1352   CREATININE 0.76 09/10/2013 1403   CALCIUM 9.6 08/12/2017 1352   PROT 7.0 08/12/2017 1352   ALBUMIN 4.4 08/12/2017 1352   AST 15 08/12/2017 1352   ALT 9 08/12/2017 1352   ALKPHOS 62 08/12/2017 1352   BILITOT 0.4 08/12/2017 1352   GFRNONAA 95 08/12/2017 1352   GFRAA 109 08/12/2017 1352       Component Value Date/Time   WBC 13.6 (H) 08/12/2017 1352   WBC 10.9 11/29/2016 1636   RBC 4.26 08/12/2017 1352   RBC 4.03 11/29/2016 1636   HGB 14.0 08/12/2017 1352   HCT 41.8 08/12/2017 1352   PLT 347 08/12/2017 1352   MCV 98 (H) 08/12/2017 1352   MCH 32.9 08/12/2017 1352   MCH 33.1 11/29/2016 1636   MCHC 33.5 08/12/2017 1352   MCHC 33.7 11/29/2016 1636   RDW 14.2 08/12/2017 1352   LYMPHSABS 2.8 08/12/2017 1352   MONOABS 0.5 09/10/2013 1403   EOSABS 0.1 08/12/2017 1352   BASOSABS 0.0 08/12/2017 1352    No results found for: "POCLITH", "LITHIUM"   No results found for: "PHENYTOIN", "PHENOBARB", "VALPROATE", "CBMZ"   .res Assessment: Plan:    Plan:  1. Xanax 0.'5mg'$  daily 2. Xyprexa '5mg'$  - one tablet daily  RTC 6 months   Time spent with patient was 15 minutes. Greater than 50% of face to face time with patient was spent on counseling and coordination of care.    Patient advised to contact office with any questions, adverse  effects, or acute worsening in signs and symptoms.  Discussed potential metabolic side effects associated with atypical antipsychotics, as well as potential risk for movement side effects. Advised pt to contact office if movement side effects occur.   Discussed potential benefits, risk, and side effects of benzodiazepines to include potential risk of tolerance and dependence, as well as possible drowsiness. Advised patient not to drive if experiencing drowsiness and to take lowest possible effective dose to minimize risk of dependence and tolerance. Diagnoses and all orders for  this visit:  Major depressive disorder, recurrent episode, moderate (HCC) -     OLANZapine (ZYPREXA) 5 MG tablet; Take one tablet at bedtime.  Generalized anxiety disorder -     OLANZapine (ZYPREXA) 5 MG tablet; Take one tablet at bedtime. -     ALPRAZolam (XANAX) 0.5 MG tablet; TAKE 1 TABLET(0.5 MG) BY MOUTH DAILY AS NEEDED FOR ANXIETY  Bipolar I disorder (HCC) -     OLANZapine (ZYPREXA) 5 MG tablet; Take one tablet at bedtime.     Please see After Visit Summary for patient specific instructions.  No future appointments.   No orders of the defined types were placed in this encounter.   -------------------------------

## 2021-09-06 ENCOUNTER — Ambulatory Visit: Payer: BC Managed Care – PPO | Admitting: Family Medicine

## 2021-09-06 ENCOUNTER — Encounter: Payer: Self-pay | Admitting: Family Medicine

## 2021-09-06 VITALS — BP 114/66 | HR 87 | Temp 97.2°F | Wt 131.0 lb

## 2021-09-06 DIAGNOSIS — Z716 Tobacco abuse counseling: Secondary | ICD-10-CM

## 2021-09-06 DIAGNOSIS — N870 Mild cervical dysplasia: Secondary | ICD-10-CM | POA: Insufficient documentation

## 2021-09-06 DIAGNOSIS — F17209 Nicotine dependence, unspecified, with unspecified nicotine-induced disorders: Secondary | ICD-10-CM

## 2021-09-06 NOTE — Progress Notes (Signed)
[facility] [address] [place] [address] Dept: [phone] Dept Fax: [phone]  Office Visit  Subjective:    Patient ID: Kelly Vincent, female    DOB: 09-19-1970, 51 y.o..   MRN: [id number]  Chief Complaint  Patient presents with   Follow-up    Wants to discuss stopping smoking.  Has been using Nicorette gum.    History of Present Illness:  Patient is in today to discuss smoking cessaiton. 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 With her new insurance, she will get a reduced rate if she has a visit to address her smoking. She does admit to a desire to quit. Kelly Vincent currently smokes 1/2-3/4 ppd. She smokes her first cigarette about 15 min. after getting up. 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 She typically smokes on her way to and from work and then into the evening. She prefers not to use Chantix in light of her other medications. She tried bupropion in the past, but it gave her an overly elevated mood. She does currently have some Nicorette gum and prefers to use this to quit.  Past Medical History: Patient Active Problem List   Diagnosis Date Noted   Generalized anxiety disorder 05/16/2021   Insomnia 05/16/2021   Bipolar I disorder ([place]) 05/16/2021   History of colon polyps 09/01/2020   Allergic rhinitis 07/26/2020   History of kidney stones 07/26/2020   Borderline hyperlipidemia 07/26/2020   Breast lump 02/19/2019   Genital herpes simplex 08/12/2017   Tobacco use disorder, continuous 08/19/2012   Major depressive disorder, recurrent ([place]) 08/19/2012   Past Surgical History:  Procedure Laterality Date   OVARIAN CYST SURGERY     rupture of cyst   Family History  Adopted: Yes  Problem Relation Age of Onset   Autism Son    Outpatient Medications Prior to Visit  Medication Sig  Dispense Refill   ALPRAZolam (XANAX) 0.5 MG tablet TAKE 1 TABLET(0.5 MG) BY MOUTH DAILY AS NEEDED FOR ANXIETY 30 tablet 2   cetirizine (ZYRTEC) 10 MG tablet Take 10 mg by mouth as needed.     nicotine polacrilex (NICORETTE) 2 MG gum Take 1 each (2 mg total) by mouth as needed for smoking cessation. 100 tablet 0   OLANZapine (ZYPREXA) 5 MG tablet Take one tablet at bedtime. 30 tablet 5   valACYclovir (VALTREX) 500 MG tablet      No facility-administered medications prior to visit.   Allergies  Allergen Reactions   Codeine Nausea And Vomiting   Short Ragweed Pollen Ext    Sulfa Antibiotics Hives, Rash and Itching     Objective:   Today's Vitals   09/06/21 1612  BP: 114/66  Pulse: 87  Temp: (!) 97.2 F (36.2 C)  SpO2: 96%  Weight: 131 lb (59.4 kg)   Body mass index is 21.47 kg/m.   General: Well developed, well nourished. No acute distress. Psych: Alert and oriented. Normal mood and affect.  Health Maintenance Due  Topic Date Due   Zoster Vaccines- Shingrix (1 of 2) Never done   INFLUENZA VACCINE  09/05/2021     Assessment & Plan:   1. Tobacco use disorder, continuous 2. Encounter for smoking cessation counseling  Kelly Vincent has low to moderate nicotine dependence. I did recommend she stop smoking. I advised her to set a quit  date. Between now and then, she should be mindful of her habitual patterns of smoking and troubleshoot alternative behaviors to help break these habits. I reviewed quit day activities to increase her chances of success. We discussed anticipated symptoms of nicotine withdrawal. She will use her Nicorette gum 2 mg to chew as needed. I spent 8 min. counseling her regarding smoking cessation.  Additionally, we reviewed criteria for lung cancer screening, which she meets. She is interested in doing an annual LDCT.  - CT CHEST LUNG CA SCREEN LOW DOSE W/O CM; Future  Return in about 4 weeks (around 10/04/2021) for Reassessment.   Haydee Salter, MD

## 2021-09-06 NOTE — Patient Instructions (Signed)
Prescription Nicotine Replacement Medications for Quitting Smoking You will learn about prescription nicotine replacement medications you can use to help you quit smoking. To view the content, go to this web address: https://pe.elsevier.[email or web address]  This video will expire on: 04/25/2023. If you need access to this video following this date, please reach out to the healthcare provider who assigned it to you. This information is not intended to replace advice given to you by your health care provider. Make sure you discuss any questions you have with your health care provider. Elsevier Patient Education  2Vass

## 2021-09-13 ENCOUNTER — Ambulatory Visit
Admission: RE | Admit: 2021-09-13 | Discharge: 2021-09-13 | Disposition: A | Discharge status: Home / Self Care | Payer: BC Managed Care – PPO | Source: Ambulatory Visit | Attending: Family Medicine | Admitting: Family Medicine

## 2021-09-13 DIAGNOSIS — F17209 Nicotine dependence, unspecified, with unspecified nicotine-induced disorders: Secondary | ICD-10-CM

## 2021-09-14 ENCOUNTER — Encounter: Payer: Self-pay | Admitting: Family Medicine

## 2021-09-14 ENCOUNTER — Telehealth: Payer: Self-pay | Admitting: Family Medicine

## 2021-09-14 DIAGNOSIS — J449 Chronic obstructive pulmonary disease, unspecified: Secondary | ICD-10-CM | POA: Insufficient documentation

## 2021-09-14 DIAGNOSIS — I2510 Atherosclerotic heart disease of native coronary artery without angina pectoris: Secondary | ICD-10-CM

## 2021-09-14 NOTE — Telephone Encounter (Signed)
Spoke to patient and advised that the CT scan hasn't been reviewed yet and that we would get back to her.   Please review and advise.  Thanks.  Dm/cma

## 2021-09-14 NOTE — Telephone Encounter (Signed)
Caller Name: Pt Call back phone #: [phone]  Reason for Call: Pt had a lung scan and received her results and would like to know next step

## 2021-09-15 ENCOUNTER — Encounter: Payer: Self-pay | Admitting: Family Medicine

## 2021-09-15 DIAGNOSIS — N200 Calculus of kidney: Secondary | ICD-10-CM | POA: Insufficient documentation

## 2021-09-15 NOTE — Telephone Encounter (Signed)
C/o of the spot that was found on her liver. Should she f/u with you?  Dm/cma

## 2021-09-15 NOTE — Telephone Encounter (Signed)
Patient notified VIA phone.  No questions.  Dm/cma ? ?

## 2021-11-21 NOTE — Progress Notes (Signed)
Kelly Saunas, MD Reason for referral-coronary artery disease  HPI: 51 year old female for evaluation of coronary artery disease at request of Glean Salen, MD.  Echocardiogram December 2017 showed normal LV function, trivial tricuspid regurgitation.  Exercise treadmill December 2017 apparently normal as well.  Chest CT August 2023 showed coronary calcification including left main and three-vessel disease.  Cardiology now asked to evaluate.  Patient denies dyspnea on exertion, orthopnea, PND, pedal edema, palpitations or syncope.  She has occasional chest tightness when she is "stressed".  However she does not have exertional chest pain.  Current Outpatient Medications  Medication Sig Dispense Refill   ALPRAZolam (XANAX) 0.5 MG tablet TAKE 1 TABLET(0.5 MG) BY MOUTH DAILY AS NEEDED FOR ANXIETY 30 tablet 2   cetirizine (ZYRTEC) 10 MG tablet Take 10 mg by mouth as needed.     OLANZapine (ZYPREXA) 5 MG tablet Take one tablet at bedtime. 30 tablet 5   nicotine polacrilex (NICORETTE) 2 MG gum Take 1 each (2 mg total) by mouth as needed for smoking cessation. (Patient not taking: Reported on 12/04/2021) 100 tablet 0   No current facility-administered medications for this visit.    Allergies  Allergen Reactions   Codeine Nausea And Vomiting   Short Ragweed Pollen Ext    Sulfa Antibiotics Hives, Rash and Itching     Past Medical History:  Diagnosis Date   Allergy    Anxiety    Arthritis    Coronary artery calcification    Herpes    Hyperlipidemia    Palpitations     Past Surgical History:  Procedure Laterality Date   OVARIAN CYST SURGERY     rupture of cyst    Social History   Socioeconomic History   Marital status: Single    Spouse name: Not on file   Number of children: 1   Years of education: Not on file   Highest education level: Bachelor's degree (e.g., BA, AB, BS)  Occupational History   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  [employer]  Tobacco Use   Smoking status: Every Day    Packs/day: 1.00    Years: 30.00    Total pack years: 30.00    Types: Cigarettes   Smokeless tobacco: Never  Vaping Use   Vaping Use: Never used  Substance and Sexual Activity   Alcohol use: Yes    Alcohol/week: 2.0 standard drinks of alcohol    Types: 2 Shots of liquor per week    Comment: Occasional   Drug use: No   Sexual activity: Yes    Comment: Fiancee  Other Topics Concern   Not on file  Social History Narrative   Significant other   College education   Works as a Nutritional therapist   Social Determinants of Radio broadcast assistant Strain: Not on file  Food Insecurity: Not on file  Transportation Needs: Not on file  Physical Activity: Not on file  Stress: Not on file  Social Connections: Not on file  Intimate Partner Violence: Not on file    Family History  Adopted: Yes  Problem Relation Age of Onset   Autism Son     ROS: no fevers or chills, productive cough, hemoptysis, dysphasia, odynophagia, melena, hematochezia, dysuria, hematuria, rash, seizure activity, orthopnea, PND, pedal edema, claudication. Remaining systems are negative.  Physical Exam:   Blood pressure 106/70, pulse 74, height 5' 5.5" (1.664 m), weight 128 lb 12.8 oz (  58.4 kg), SpO2 97 %.  General:  Well developed/well nourished in NAD Skin warm/dry Patient not depressed No peripheral clubbing Back-normal HEENT-normal/normal eyelids Neck supple/normal carotid upstroke bilaterally; no bruits; no JVD; no thyromegaly chest - CTA/ normal expansion CV - RRR/normal S1 and S2; no murmurs, rubs or gallops;  PMI nondisplaced Abdomen -NT/ND, no HSM, no mass, + bowel sounds, no bruit 2+ femoral pulses, no bruits Ext-no edema, chords, 2+ DP Neuro-grossly nonfocal  ECG -normal sinus rhythm at a rate of 74, no ST changes.  Personally reviewed  A/P  1 coronary calcification-patient noted to have coronary  calcification on prior CT.  We will treat with aspirin 81 mg daily and Crestor 40 mg daily.  We will arrange stress nuclear study to screen for ischemia.  2 tobacco abuse-patient counseled on discontinuing.  3 history of hyperlipidemia-begin Crestor 40 mg daily.  Check lipids and liver in 8 weeks.  Kirk Ruths, MD

## 2021-12-04 ENCOUNTER — Encounter: Payer: Self-pay | Admitting: *Deleted

## 2021-12-04 ENCOUNTER — Encounter: Payer: Self-pay | Admitting: Cardiology

## 2021-12-04 ENCOUNTER — Ambulatory Visit: Payer: BC Managed Care – PPO | Admitting: Cardiology

## 2021-12-04 VITALS — BP 106/70 | HR 74 | Ht 65.5 in | Wt 128.8 lb

## 2021-12-04 DIAGNOSIS — E7800 Pure hypercholesterolemia, unspecified: Secondary | ICD-10-CM | POA: Diagnosis not present

## 2021-12-04 DIAGNOSIS — I2584 Coronary atherosclerosis due to calcified coronary lesion: Secondary | ICD-10-CM

## 2021-12-04 DIAGNOSIS — I2510 Atherosclerotic heart disease of native coronary artery without angina pectoris: Secondary | ICD-10-CM

## 2021-12-04 DIAGNOSIS — F17209 Nicotine dependence, unspecified, with unspecified nicotine-induced disorders: Secondary | ICD-10-CM

## 2021-12-04 MED ORDER — ASPIRIN 81 MG PO TBEC: 81.0000 mg | DELAYED_RELEASE_TABLET | Freq: Every day | ORAL | 3 refills | Status: DC

## 2021-12-04 MED ORDER — ROSUVASTATIN CALCIUM 40 MG PO TABS: 40.0000 mg | ORAL_TABLET | Freq: Every day | ORAL | 3 refills | Status: DC

## 2021-12-04 NOTE — Patient Instructions (Signed)
Medication Instructions:   START ASPIRIN 81 MG ONCE DAILY  START ROSUVASTATIN 40 MG ONCE DAILY  *If you need a refill on your cardiac medications before your next appointment, please call your pharmacy*   Lab Work:  Your physician recommends that you return for lab work in: [place]  If you have labs (blood work) drawn today and your tests are completely normal, you will receive your results only by: [place] (if you have MyChart) OR A paper copy in the mail If you have any lab test that is abnormal or we need to change your treatment, we will call you to review the results.   Testing/Procedures:  Your physician has requested that you have en exercise stress myoview. For further information please visit [email or web address]. Please follow instruction sheet, as given.  [place], you and your health needs are our priority.  As part of our continuing mission to provide you with exceptional heart care, we have created designated Provider Care Teams.  These Care Teams include your primary Cardiologist (physician) and Advanced Practice Providers (APPs -  Physician Assistants and Nurse Practitioners) who all work together to provide you with the care you need, when you need it.  We recommend signing up for the patient portal called "MyChart".  Sign up information is provided on this After Visit Summary.  MyChart is used to connect with patients for Virtual Visits (Telemedicine).  Patients are able to view lab/test results, encounter notes, upcoming appointments, etc.  Non-urgent messages can be sent to your provider as well.   To learn more about what you can do with MyChart, go to [email or web address].    Your next appointment:   12 month(s)  The format for your next appointment:   In Person  Provider:   Kirk Ruths, MD

## 2022-01-03 ENCOUNTER — Encounter (HOSPITAL_COMMUNITY): Payer: Self-pay | Admitting: *Deleted

## 2022-01-08 ENCOUNTER — Ambulatory Visit (HOSPITAL_COMMUNITY): Payer: BC Managed Care – PPO | Attending: Cardiology

## 2022-01-08 DIAGNOSIS — I2510 Atherosclerotic heart disease of native coronary artery without angina pectoris: Secondary | ICD-10-CM | POA: Diagnosis present

## 2022-01-08 DIAGNOSIS — I2584 Coronary atherosclerosis due to calcified coronary lesion: Secondary | ICD-10-CM | POA: Diagnosis present

## 2022-01-08 LAB — MYOCARDIAL PERFUSION IMAGING
Angina Index: 0
Duke Treadmill Score: 10
Estimated workload: 11.7
Exercise duration (min): 10 min
LV dias vol: 54 mL (ref 46–106)
LV sys vol: 19 mL
MPHR: 167 {beats}/min
Nuc Stress EF: 65 %
Peak HR: 157 {beats}/min
Percent HR: 94 %
RPE: 18
Rest HR: 66 {beats}/min
Rest Nuclear Isotope Dose: 10.3 mCi
SDS: 1
SRS: 1
SSS: 2
ST Depression (mm): 0 mm
Stress Nuclear Isotope Dose: 32.7 mCi
TID: 0.84

## 2022-01-08 MED ORDER — TECHNETIUM TC 99M TETROFOSMIN IV KIT
32.7000 | PACK | Freq: Once | INTRAVENOUS | Status: AC | PRN
  Administered 2022-01-08: 32.7 via INTRAVENOUS

## 2022-01-08 MED ORDER — TECHNETIUM TC 99M TETROFOSMIN IV KIT
10.3000 | PACK | Freq: Once | INTRAVENOUS | Status: AC | PRN
  Administered 2022-01-08: 10.3 via INTRAVENOUS

## 2022-01-13 ENCOUNTER — Encounter (HOSPITAL_COMMUNITY): Payer: Self-pay

## 2022-01-13 ENCOUNTER — Ambulatory Visit (HOSPITAL_COMMUNITY)
Admission: EM | Admit: 2022-01-13 | Discharge: 2022-01-13 | Disposition: A | Discharge status: Home / Self Care | Payer: BC Managed Care – PPO | Attending: Physician Assistant | Admitting: Physician Assistant

## 2022-01-13 DIAGNOSIS — J4521 Mild intermittent asthma with (acute) exacerbation: Secondary | ICD-10-CM | POA: Diagnosis not present

## 2022-01-13 MED ORDER — AZITHROMYCIN 250 MG PO TABS: ORAL_TABLET | ORAL | 0 refills | Status: DC

## 2022-01-13 MED ORDER — PREDNISONE 20 MG PO TABS: 20.0000 mg | ORAL_TABLET | Freq: Two times a day (BID) | ORAL | 0 refills | Status: AC

## 2022-01-13 NOTE — Discharge Instructions (Signed)
Presentation today seems consistent with asthmatic like bronchitis.  Given your smoking history, I do think antibiotics and steroids are appropriate.  Please take the Z-Pak and prednisone as directed.  You may take Tylenol with the prednisone, but no NSAIDs such as ibuprofen or Aleve.  Please use your rescue albuterol inhaler 3-4 times daily over the next 5 days.  Follow-up with your primary care if no improvement.  Emergency department if severely worse symptoms.

## 2022-01-13 NOTE — ED Triage Notes (Signed)
Chief Complaint: productive cough with yellow mucus, sinus bleeding, congestion . No chills or fever, Patient having runny nose.   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   New foods, medications, or products: Yes- cholesterol meds a month ago  Recent Travel: No

## 2022-01-13 NOTE — ED Provider Notes (Signed)
[facility]   MRN: [id number] DOB: [birth year]  Subjective:   Kelly Vincent is a 51 y.o. female presenting for productive cough, wheezing, sinus congestion and bleeding for the last week and a half.  She denies any fever or chills.  No body aches.  No shortness of breath or chest pain.  She has been taking DayQuil, NyQuil, Mucinex with some relief.  She has a rescue inhaler at home but has not been using this.  She denies any history of asthma or COPD.  She does smoke cigarettes.  Works as an Automotive engineer.  No current facility-administered medications for this encounter.  Current Outpatient Medications:    ALPRAZolam (XANAX) 0.5 MG tablet, TAKE 1 TABLET(0.5 MG) BY MOUTH DAILY AS NEEDED FOR ANXIETY, Disp: 30 tablet, Rfl: 2   aspirin EC 81 MG tablet, Take 1 tablet (81 mg total) by mouth daily. Swallow whole., Disp: 90 tablet, Rfl: 3   azithromycin (ZITHROMAX Z-PAK) 250 MG tablet, Take two tablets on day one, followed by one tablet daily for the next four days., Disp: 6 tablet, Rfl: 0   OLANZapine (ZYPREXA) 5 MG tablet, Take one tablet at bedtime., Disp: 30 tablet, Rfl: 5   predniSONE (DELTASONE) 20 MG tablet, Take 1 tablet (20 mg total) by mouth 2 (two) times daily with a meal for 5 days., Disp: 10 tablet, Rfl: 0   rosuvastatin (CRESTOR) 40 MG tablet, Take 1 tablet (40 mg total) by mouth daily., Disp: 90 tablet, Rfl: 3   cetirizine (ZYRTEC) 10 MG tablet, Take 10 mg by mouth as needed., Disp: , Rfl:    nicotine polacrilex (NICORETTE) 2 MG gum, Take 1 each (2 mg total) by mouth as needed for smoking cessation., Disp: 100 tablet, Rfl: 0   Allergies  Allergen Reactions   Codeine Nausea And Vomiting   Short Ragweed Pollen Ext    Sulfa Antibiotics Hives, Rash and Itching    Past Medical History:  Diagnosis Date   Allergy    Anxiety    Arthritis    Coronary artery calcification    Herpes    Hyperlipidemia    Palpitations      Past Surgical History:   Procedure Laterality Date   OVARIAN CYST SURGERY     rupture of cyst    Family History  Adopted: Yes  Problem Relation Age of Onset   Autism Son     Social History   Tobacco Use   Smoking status: Every Day    Packs/day: 1.00    Years: 30.00    Total pack years: 30.00    Types: Cigarettes   Smokeless tobacco: Never  Vaping Use   Vaping Use: Never used  Substance Use Topics   Alcohol use: Yes    Alcohol/week: 2.0 standard drinks of alcohol    Types: 2 Shots of liquor per week    Comment: Occasional   Drug use: No    ROS REFER TO HPI FOR PERTINENT POSITIVES AND NEGATIVES   Objective:   Vitals: BP 105/73 (BP Location: Left Arm)   Pulse 91   Temp 98.2 F (36.8 C) (Oral)   Resp 16   SpO2 94%   Physical Exam Vitals and nursing note reviewed.  Constitutional:      General: She is not in acute distress.    Appearance: Normal appearance. She is not ill-appearing.  HENT:     Head: Normocephalic.     Right Ear: Tympanic membrane, ear canal and external  ear normal.     Left Ear: Tympanic membrane, ear canal and external ear normal.     Nose: Congestion present.     Mouth/Throat:     Mouth: Mucous membranes are moist.     Pharynx: No oropharyngeal exudate or posterior oropharyngeal erythema.     Tonsils: No tonsillar exudate or tonsillar abscesses.  Eyes:     Extraocular Movements: Extraocular movements intact.     Conjunctiva/sclera: Conjunctivae normal.     Pupils: Pupils are equal, round, and reactive to light.  Cardiovascular:     Rate and Rhythm: Normal rate and regular rhythm.     Pulses: Normal pulses.     Heart sounds: Normal heart sounds. No murmur heard. Pulmonary:     Effort: Pulmonary effort is normal. No respiratory distress.     Breath sounds: Wheezing and rhonchi present.     Comments: Productive cough Musculoskeletal:     Cervical back: Normal range of motion.  Skin:    General: Skin is warm.  Neurological:     Mental Status: She is alert  and oriented to person, place, and time.  Psychiatric:        Mood and Affect: Mood normal.        Behavior: Behavior normal.     No results found for this or any previous visit (from the past 24 hour(s)).  Assessment and Plan :   PDMP not reviewed this encounter.  1. Mild intermittent asthma with acute exacerbation    Presents like asthmatic bronchitis with possible sinus infection.  Given her symptoms lasting greater than 10 days, I do feel that antibiotic treatment is appropriate at this time.  She reports a good history with Z-Pak in the past.  Sent a prescription for this with prednisone tablets. Pt aware of risks vs benefits and possible adverse reactions.  She has plenty of albuterol left on her inhaler at home and I encouraged her to use this 3-4 times daily over the next 5 days.  She is going to rest and push fluids.  She will follow-up with PCP if needed.  ER precautions discussed.    AllwardtRanda Evens, PA-C 01/13/22 1533

## 2022-02-01 ENCOUNTER — Ambulatory Visit: Payer: BC Managed Care – PPO | Admitting: Adult Health

## 2022-02-01 ENCOUNTER — Encounter: Payer: Self-pay | Admitting: Adult Health

## 2022-02-01 DIAGNOSIS — F319 Bipolar disorder, unspecified: Secondary | ICD-10-CM | POA: Diagnosis not present

## 2022-02-01 DIAGNOSIS — F411 Generalized anxiety disorder: Secondary | ICD-10-CM

## 2022-02-01 DIAGNOSIS — F331 Major depressive disorder, recurrent, moderate: Secondary | ICD-10-CM

## 2022-02-01 LAB — HEPATIC FUNCTION PANEL
AG Ratio: 1.7 (calc) (ref 1.0–2.5)
ALT: 22 U/L (ref 6–29)
AST: 18 U/L (ref 10–35)
Albumin: 4.3 g/dL (ref 3.6–5.1)
Alkaline phosphatase (APISO): 72 U/L (ref 37–153)
Bilirubin, Direct: 0.1 mg/dL (ref 0.0–0.2)
Globulin: 2.6 g/dL (calc) (ref 1.9–3.7)
Indirect Bilirubin: 0.4 mg/dL (calc) (ref 0.2–1.2)
Total Bilirubin: 0.5 mg/dL (ref 0.2–1.2)
Total Protein: 6.9 g/dL (ref 6.1–8.1)

## 2022-02-01 LAB — LIPID PANEL
Cholesterol: 118 mg/dL (ref <200)
HDL: 50 mg/dL (ref >=50)
LDL Cholesterol (Calc): 46 mg/dL (calc)
Non-HDL Cholesterol (Calc): 68 mg/dL (calc) (ref <130)
Total CHOL/HDL Ratio: 2.4 (calc) (ref <5.0)
Triglycerides: 138 mg/dL (ref <150)

## 2022-02-01 MED ORDER — OLANZAPINE 5 MG PO TABS: ORAL_TABLET | ORAL | 5 refills | Status: DC

## 2022-02-01 MED ORDER — ALPRAZOLAM 0.5 MG PO TABS: ORAL_TABLET | ORAL | 2 refills | Status: DC

## 2022-02-01 NOTE — Progress Notes (Signed)
Kelly Vincent [id number] Jun 20, 1970 51 y.o.  Subjective:   Patient ID:  Kelly Vincent is a 51 y.o. (DOB [birth year]) female.  Chief Complaint: No chief complaint on file.   HPI Kelly Vincent presents to the office today for follow-up of GAD, MDD and BPD 1.  Describes mood today as "not the best". Pleasant. Denies tearfulness. Mood symptoms - reports depression, anxiety and irritability at times. Reports worry and rumination. Reporting increased situational stressors in work setting and with son. Mood is lower. Stating "I'm not doing good". Feels like current medication regimen is helpful. Living with boyfriend full time and is home every day managing sons needs. Decreased interest and motivation. Taking medications as prescribed.  Energy levels lower. Active, does not have a regular exercise routine. Walking on the treadmill. Enjoys some usual interests and activities. Has a boyfriend. Son local - 38 y/o. Appetite adequate. Weight stable - 128 to 130 pounds. Sleeps well most nights. Averages 7 to 8 hours - hot flashes. Focus and concentration stable. Completing tasks. Managing aspects of household. 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   Denies SI or HI.  Denies AH or VH.  Previous medications: Depakote, Lexapro   GAD-7    Flowsheet Row Office Visit from 05/16/2021 in [facility] Visit from 08/24/2017 in Primary Care at [facility] from 08/12/2017 in Primary Care at [facility]  Total GAD-7 Score '15 8 14      '$ PHQ2-9    [address] Visit from 05/16/2021 in [facility] Visit from 07/26/2020 in [facility] from 09/30/2018 in [facility] from 12/21/2017 in [facility] from 08/24/2017 in Primary Care at [facility] Total Score 4 1 0 0 2  PHQ-9 Total Score 14 -- -- -- 12      [facility] from 01/13/2022 in  [facility] No Risk        Review of Systems:  Review of Systems  Musculoskeletal:  Negative for gait problem.  Neurological:  Negative for tremors.  Psychiatric/Behavioral:         Please refer to HPI    Medications: I have reviewed the patient's current medications.  Current Outpatient Medications  Medication Sig Dispense Refill   ALPRAZolam (XANAX) 0.5 MG tablet TAKE 1 TABLET(0.5 MG) BY MOUTH DAILY AS NEEDED FOR ANXIETY 30 tablet 2   aspirin EC 81 MG tablet Take 1 tablet (81 mg total) by mouth daily. Swallow whole. 90 tablet 3   azithromycin (ZITHROMAX Z-PAK) 250 MG tablet Take two tablets on day one, followed by one tablet daily for the next four days. 6 tablet 0   cetirizine (ZYRTEC) 10 MG tablet Take 10 mg by mouth as needed.     nicotine polacrilex (NICORETTE) 2 MG gum Take 1 each (2 mg total) by mouth as needed for smoking cessation. 100 tablet 0   OLANZapine (ZYPREXA) 5 MG tablet Take one tablet at bedtime. 30 tablet 5   rosuvastatin (CRESTOR) 40 MG tablet Take 1 tablet (40 mg total) by mouth daily. 90 tablet 3   No current facility-administered medications for this visit.    Medication Side Effects: None  Allergies:  Allergies  Allergen Reactions   Codeine Nausea And Vomiting   Short Ragweed Pollen Ext    Sulfa Antibiotics Hives, Rash and Itching    Past Medical  History:  Diagnosis Date   Allergy    Anxiety    Arthritis    Coronary artery calcification    Herpes    Hyperlipidemia    Palpitations     Past Medical History, Surgical history, Social history, and Family history were reviewed and updated as appropriate.   Please see review of systems for further details on the patient's review from today.   Objective:   Physical Exam:  There were no vitals taken for this visit.  Physical Exam Constitutional:      General: She is not in acute distress. Musculoskeletal:        General: No deformity.   Neurological:     Mental Status: She is alert and oriented to person, place, and time.     Coordination: Coordination normal.  Psychiatric:        Attention and Perception: Attention and perception normal. She does not perceive auditory or visual hallucinations.        Mood and Affect: Mood normal. Mood is not anxious or depressed. Affect is not labile, blunt, angry or inappropriate.        Speech: Speech normal.        Behavior: Behavior normal.        Thought Content: Thought content normal. Thought content is not paranoid or delusional. Thought content does not include homicidal or suicidal ideation. Thought content does not include homicidal or suicidal plan.        Cognition and Memory: Cognition and memory normal.        Judgment: Judgment normal.     Comments: Insight intact     Lab Review:     Component Value Date/Time   NA 142 08/12/2017 1352   K 4.8 08/12/2017 1352   CL 102 08/12/2017 1352   CO2 24 08/12/2017 1352   GLUCOSE 71 08/12/2017 1352   GLUCOSE 102 (H) 11/29/2016 1636   BUN 9 08/12/2017 1352   CREATININE 0.75 08/12/2017 1352   CREATININE 0.76 09/10/2013 1403   CALCIUM 9.6 08/12/2017 1352   PROT 7.0 08/12/2017 1352   ALBUMIN 4.4 08/12/2017 1352   AST 15 08/12/2017 1352   ALT 9 08/12/2017 1352   ALKPHOS 62 08/12/2017 1352   BILITOT 0.4 08/12/2017 1352   GFRNONAA 95 08/12/2017 1352   GFRAA 109 08/12/2017 1352       Component Value Date/Time   WBC 13.6 (H) 08/12/2017 1352   WBC 10.9 11/29/2016 1636   RBC 4.26 08/12/2017 1352   RBC 4.03 11/29/2016 1636   HGB 14.0 08/12/2017 1352   HCT 41.8 08/12/2017 1352   PLT 347 08/12/2017 1352   MCV 98 (H) 08/12/2017 1352   MCH 32.9 08/12/2017 1352   MCH 33.1 11/29/2016 1636   MCHC 33.5 08/12/2017 1352   MCHC 33.7 11/29/2016 1636   RDW 14.2 08/12/2017 1352   LYMPHSABS 2.8 08/12/2017 1352   MONOABS 0.5 09/10/2013 1403   EOSABS 0.1 08/12/2017 1352   BASOSABS 0.0 08/12/2017 1352    No results found for:  "POCLITH", "LITHIUM"   No results found for: "PHENYTOIN", "PHENOBARB", "VALPROATE", "CBMZ"   .res Assessment: Plan:    Plan:  1. Xanax 0.'5mg'$  daily 2. Xyprexa '5mg'$  - one tablet daily  RTC 6 months   Time spent with patient was 15 minutes. Greater than 50% of face to face time with patient was spent on counseling and coordination of care.    Patient advised to contact office with any questions, adverse effects, or acute worsening in  signs and symptoms.  Discussed potential metabolic side effects associated with atypical antipsychotics, as well as potential risk for movement side effects. Advised pt to contact office if movement side effects occur.   Discussed potential benefits, risk, and side effects of benzodiazepines to include potential risk of tolerance and dependence, as well as possible drowsiness. Advised patient not to drive if experiencing drowsiness and to take lowest possible effective dose to minimize risk of dependence and tolerance. Diagnoses and all orders for this visit:  Major depressive disorder, recurrent episode, moderate (HCC) -     OLANZapine (ZYPREXA) 5 MG tablet; Take one tablet at bedtime.  Generalized anxiety disorder -     OLANZapine (ZYPREXA) 5 MG tablet; Take one tablet at bedtime. -     ALPRAZolam (XANAX) 0.5 MG tablet; TAKE 1 TABLET(0.5 MG) BY MOUTH DAILY AS NEEDED FOR ANXIETY  Bipolar I disorder (HCC) -     OLANZapine (ZYPREXA) 5 MG tablet; Take one tablet at bedtime.     Please see After Visit Summary for patient specific instructions.  No future appointments.   No orders of the defined types were placed in this encounter.   -------------------------------

## 2022-02-08 ENCOUNTER — Encounter: Payer: Self-pay | Admitting: *Deleted

## 2022-06-06 ENCOUNTER — Other Ambulatory Visit: Payer: Self-pay

## 2022-06-06 DIAGNOSIS — F411 Generalized anxiety disorder: Secondary | ICD-10-CM

## 2022-06-06 MED ORDER — ALPRAZOLAM 0.5 MG PO TABS: ORAL_TABLET | ORAL | 1 refills | Status: DC

## 2022-07-30 ENCOUNTER — Encounter: Payer: Self-pay | Admitting: Family Medicine

## 2022-07-30 ENCOUNTER — Ambulatory Visit (INDEPENDENT_AMBULATORY_CARE_PROVIDER_SITE_OTHER): Payer: BC Managed Care – PPO | Admitting: Family Medicine

## 2022-07-30 VITALS — BP 118/66 | HR 73 | Temp 98.1°F | Ht 65.0 in | Wt 121.4 lb

## 2022-07-30 DIAGNOSIS — F17209 Nicotine dependence, unspecified, with unspecified nicotine-induced disorders: Secondary | ICD-10-CM | POA: Diagnosis not present

## 2022-07-30 DIAGNOSIS — Z0000 Encounter for general adult medical examination without abnormal findings: Secondary | ICD-10-CM | POA: Diagnosis not present

## 2022-07-30 DIAGNOSIS — I2510 Atherosclerotic heart disease of native coronary artery without angina pectoris: Secondary | ICD-10-CM

## 2022-07-30 DIAGNOSIS — I2584 Coronary atherosclerosis due to calcified coronary lesion: Secondary | ICD-10-CM

## 2022-07-30 DIAGNOSIS — E785 Hyperlipidemia, unspecified: Secondary | ICD-10-CM

## 2022-07-30 DIAGNOSIS — F17210 Nicotine dependence, cigarettes, uncomplicated: Secondary | ICD-10-CM

## 2022-07-30 NOTE — Assessment & Plan Note (Signed)
Continues to smoke <1 ppd. Does not smoke while at work. Riding in her car is her main trigger. We discussed option for assisting smoking cessation. She prefers not to take Chantix or bupropion. We discussed NRT. She want sot try using the nicotine gum again.  I spent 5 minutes counseling the patient about tobacco cessation.

## 2022-07-30 NOTE — Assessment & Plan Note (Signed)
At goal. Continue rosuvastatin 40 mg daily. 

## 2022-07-30 NOTE — Progress Notes (Signed)
[facility] Trecia Rogers [facility] [address] [place] [address] Dept: [phone] Dept Fax: [phone]  Annual Physical Visit  Subjective:    Patient ID: Kelly Vincent, female    DOB: 06/07/1970, 52 y.o..   MRN: [id number]  Chief Complaint  Patient presents with   Annual Exam    CPE/labs.  No concerns.  Fasting today .   History of Present Illness:  Patient is in today for an annual physical/preventative visit.  Review of Systems  Constitutional:  Positive for weight loss. Negative for chills, diaphoresis, fever and malaise/fatigue.       Notes about 12 lbs. of weight loss. She feels this is a stress reaction.  HENT:  Positive for tinnitus. Negative for congestion, ear pain, hearing loss, sinus pain and sore throat.        Intermittent after a music concert.  Eyes:  Negative for blurred vision, pain, discharge and redness.  Respiratory:  Negative for cough, shortness of breath and wheezing.   Cardiovascular:  Negative for chest pain and palpitations.  Gastrointestinal:  Negative for abdominal pain, constipation, diarrhea, heartburn, nausea and vomiting.  Musculoskeletal:  Negative for back pain, joint pain and myalgias.  Skin:  Negative for itching and rash.  Psychiatric/Behavioral:  Negative for depression. The patient is not nervous/anxious.        Currently engaged in counseling.Continues on olanzapine 5 mg daily and PRN alprazolam.   Past Medical History: Patient Active Problem List   Diagnosis Date Noted   Nephrolithiasis 09/15/2021   COPD (chronic obstructive pulmonary disease) (HCC) 09/14/2021   Coronary artery disease due to calcified coronary lesion 09/14/2021   Cervical intraepithelial neoplasia grade 1 09/06/2021   Generalized anxiety disorder 05/16/2021   Insomnia 05/16/2021   Bipolar I disorder (HCC) 05/16/2021   History of colon polyps 09/01/2020   Allergic rhinitis 07/26/2020   History of kidney stones 07/26/2020    Borderline hyperlipidemia 07/26/2020   Breast lump 02/19/2019   Genital herpes simplex 08/12/2017   Tobacco use disorder, continuous 08/19/2012   Major depressive disorder, recurrent (HCC) 08/19/2012   Past Surgical History:  Procedure Laterality Date   OVARIAN CYST SURGERY     rupture of cyst   Family History  Adopted: Yes  Problem Relation Age of Onset   Autism Son    Outpatient Medications Prior to Visit  Medication Sig Dispense Refill   albuterol (VENTOLIN HFA) 108 (90 Base) MCG/ACT inhaler Inhale 2 puffs into the lungs every 6 (six) hours as needed.     ALPRAZolam (XANAX) 0.5 MG tablet TAKE 1 TABLET(0.5 MG) BY MOUTH DAILY AS NEEDED FOR ANXIETY 30 tablet 1   aspirin EC 81 MG tablet Take 1 tablet (81 mg total) by mouth daily. Swallow whole. 90 tablet 3   cetirizine (ZYRTEC) 10 MG tablet Take 10 mg by mouth as needed.     OLANZapine (ZYPREXA) 5 MG tablet Take one tablet at bedtime. 30 tablet 5   rosuvastatin (CRESTOR) 40 MG tablet Take 1 tablet (40 mg total) by mouth daily. 90 tablet 3   azithromycin (ZITHROMAX Z-PAK) 250 MG tablet Take two tablets on day one, followed by one tablet daily for the next four days. 6 tablet 0   nicotine polacrilex (NICORETTE) 2 MG gum Take 1 each (2 mg total) by mouth as needed for smoking cessation. 100 tablet 0   No facility-administered medications prior to visit.   Allergies  Allergen Reactions   Codeine Nausea And Vomiting   Short  Ragweed Pollen Ext    Sulfa Antibiotics Hives, Rash and Itching   Objective:   Today's Vitals   07/30/22 0953  BP: 118/66  Pulse: 73  Temp: 98.1 F (36.7 C)  TempSrc: Temporal  SpO2: 98%  Weight: 121 lb 6.4 oz (55.1 kg)  Height: 5\' 5"  (1.651 m)   Body mass index is 20.2 kg/m.   General: Well developed, well nourished. No acute distress. HEENT: Normocephalic, non-traumatic. PERRL, EOMI. Conjunctiva clear. External ears normal. EAC and TMs   normal bilaterally. Nose clear without congestion or  rhinorrhea. Mucous membranes moist. Mild cobblestonign of   posterior oropharynx. Good dentition. Neck: Supple. No lymphadenopathy. No thyromegaly. Lungs: Clear to auscultation bilaterally. No wheezing, rales or rhonchi. CV: RRR without murmurs or rubs. Pulses 2+ bilaterally. Abdomen: Soft, non-tender. Bowel sounds positive, normal pitch and frequency. No hepatosplenomegaly. No   rebound or guarding. Back: Straight. No CVA tenderness bilaterally. Extremities: Full ROM. No joint swelling or tenderness. No edema noted. Skin: Warm and dry. No rashes. Psych: Alert and oriented. Normal mood and affect.  Health Maintenance Due  Topic Date Due   Zoster Vaccines- Shingrix (1 of 2) Never done   MAMMOGRAM  12/02/2021   Lab Results Last lipids Lab Results  Component Value Date   CHOL 118 02/01/2022   HDL 50 02/01/2022   LDLCALC 46 02/01/2022   TRIG 138 02/01/2022   CHOLHDL 2.4 02/01/2022      Assessment & Plan:   Problem List Items Addressed This Visit       Cardiovascular and Mediastinum   Coronary artery disease due to calcified coronary lesion    Continue focus on smoking cessation and lipid management.        Other   Tobacco use disorder, continuous    Continues to smoke <1 ppd. Does not smoke while at work. Riding in her car is her main trigger. We discussed option for assisting smoking cessation. She prefers not to take Chantix or bupropion. We discussed NRT. She want sot try using the nicotine gum again.  I spent 5 minutes counseling the patient about tobacco cessation.       Borderline hyperlipidemia    At goal. Continue rosuvastatin 40 mg daily.      Other Visit Diagnoses     Annual physical exam    -  Primary   Overall health is fairly good. [name] is UTD on screenings. She does want to try smoking cessation again.       Return in about 6 months (around 01/29/2023) for Reassessment, fasting labs.   Loyola Mast, MD

## 2022-07-30 NOTE — Assessment & Plan Note (Signed)
Continue focus on smoking cessation and lipid management.

## 2022-08-01 ENCOUNTER — Other Ambulatory Visit: Payer: Self-pay | Admitting: Adult Health

## 2022-08-01 DIAGNOSIS — F319 Bipolar disorder, unspecified: Secondary | ICD-10-CM

## 2022-08-01 DIAGNOSIS — F331 Major depressive disorder, recurrent, moderate: Secondary | ICD-10-CM

## 2022-08-01 DIAGNOSIS — F411 Generalized anxiety disorder: Secondary | ICD-10-CM

## 2022-08-02 NOTE — Telephone Encounter (Signed)
Has appt. tomorrow

## 2022-08-03 ENCOUNTER — Encounter: Payer: Self-pay | Admitting: Adult Health

## 2022-08-03 ENCOUNTER — Ambulatory Visit: Payer: BC Managed Care – PPO | Admitting: Adult Health

## 2022-08-03 DIAGNOSIS — F411 Generalized anxiety disorder: Secondary | ICD-10-CM | POA: Diagnosis not present

## 2022-08-03 DIAGNOSIS — F319 Bipolar disorder, unspecified: Secondary | ICD-10-CM

## 2022-08-03 DIAGNOSIS — F331 Major depressive disorder, recurrent, moderate: Secondary | ICD-10-CM | POA: Diagnosis not present

## 2022-08-03 MED ORDER — OLANZAPINE 5 MG PO TABS: ORAL_TABLET | ORAL | 5 refills | Status: DC

## 2022-08-03 MED ORDER — ALPRAZOLAM 0.5 MG PO TABS: ORAL_TABLET | ORAL | 2 refills | Status: DC

## 2022-08-03 NOTE — Progress Notes (Signed)
Kelly Vincent [id number] Oct 24, 1970 52 y.o.  Subjective:   Patient ID:  Kelly Vincent is a 52 y.o. (DOB [birth year]) female.  Chief Complaint: No chief complaint on file.   HPI Kelly Vincent presents to the office today for follow-up of GAD, MDD and BPD 1.  Describes mood today as "ok". Pleasant. Denies tearfulness. Mood symptoms - denies depression, anxiety and irritability at times. Denies worry, rumination, and over thinking. Reporting decreased situational stressors. Mood is consistent. Stating "I'm doing good". Feels like current medication regimen works well. Living with boyfriend - son staying at her house. Stable interest and motivation. Taking medications as prescribed. Has started working with a therapist. Energy levels lower. Active, has a regular exercise routine. Walking. Enjoys some usual interests and activities. Lives with boyfriend. Son local - 72 y/o - recovering from a broken shoulder. Appetite adequate. Weight loss - 121 pounds. Sleeps well most nights. Averages 7 hours. Focus and concentration stable. Completing tasks. Managing aspects of household. 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   Denies SI or HI.  Denies AH or VH.  Previous medications: Depakote, Lexapro   GAD-7    Flowsheet Row Office Visit from 05/16/2021 in [facility] Visit from 08/24/2017 in Primary Care at [facility] Visit from 08/12/2017 in Primary Care at [facility]  Total GAD-7 Score 15 8 14       PHQ2-9    Flowsheet Row Office Visit from 07/30/2022 in [facility] Visit from 05/16/2021 in [facility] at [facility] Visit from 07/26/2020 in [facility] from 09/30/2018 in Primary Care at [place] Office Visit from 12/21/2017 in Primary Care at [facility] Total Score 1 4 1  0 0  PHQ-9 Total Score 3 14  -- -- --      Flowsheet Row ED from 01/13/2022 in [facility] RISK CATEGORY No Risk        Review of Systems:  Review of Systems  Musculoskeletal:  Negative for gait problem.  Neurological:  Negative for tremors.  Psychiatric/Behavioral:         Please refer to HPI    Medications: I have reviewed the patient's current medications.  Current Outpatient Medications  Medication Sig Dispense Refill   albuterol (VENTOLIN HFA) 108 (90 Base) MCG/ACT inhaler Inhale 2 puffs into the lungs every 6 (six) hours as needed.     ALPRAZolam (XANAX) 0.5 MG tablet TAKE 1 TABLET(0.5 MG) BY MOUTH DAILY AS NEEDED FOR ANXIETY 30 tablet 2   aspirin EC 81 MG tablet Take 1 tablet (81 mg total) by mouth daily. Swallow whole. 90 tablet 3   cetirizine (ZYRTEC) 10 MG tablet Take 10 mg by mouth as needed.     OLANZapine (ZYPREXA) 5 MG tablet Take one tablet at bedtime. 30 tablet 5   rosuvastatin (CRESTOR) 40 MG tablet Take 1 tablet (40 mg total) by mouth daily. 90 tablet 3   No current facility-administered medications for this visit.    Medication Side Effects: None  Allergies:  Allergies  Allergen Reactions   Codeine Nausea And Vomiting   Short Ragweed Pollen Ext    Sulfa Antibiotics Hives, Rash and Itching    Past Medical History:  Diagnosis Date   Allergy    Anxiety    Arthritis    Coronary artery calcification    Herpes  Hyperlipidemia    Palpitations     Past Medical History, Surgical history, Social history, and Family history were reviewed and updated as appropriate.   Please see review of systems for further details on the patient's review from today.   Objective:   Physical Exam:  There were no vitals taken for this visit.  Physical Exam Constitutional:      General: She is not in acute distress. Musculoskeletal:        General: No deformity.  Neurological:     Mental Status: She is alert and oriented to person, place, and time.      Coordination: Coordination normal.  Psychiatric:        Attention and Perception: Attention and perception normal. She does not perceive auditory or visual hallucinations.        Mood and Affect: Mood normal. Mood is not anxious or depressed. Affect is not labile, blunt, angry or inappropriate.        Speech: Speech normal.        Behavior: Behavior normal.        Thought Content: Thought content normal. Thought content is not paranoid or delusional. Thought content does not include homicidal or suicidal ideation. Thought content does not include homicidal or suicidal plan.        Cognition and Memory: Cognition and memory normal.        Judgment: Judgment normal.     Comments: Insight intact     Lab Review:     Component Value Date/Time   NA 142 08/12/2017 1352   K 4.8 08/12/2017 1352   CL 102 08/12/2017 1352   CO2 24 08/12/2017 1352   GLUCOSE 71 08/12/2017 1352   GLUCOSE 102 (H) 11/29/2016 1636   BUN 9 08/12/2017 1352   CREATININE 0.75 08/12/2017 1352   CREATININE 0.76 09/10/2013 1403   CALCIUM 9.6 08/12/2017 1352   PROT 6.9 02/01/2022 0801   PROT 7.0 08/12/2017 1352   ALBUMIN 4.4 08/12/2017 1352   AST 18 02/01/2022 0801   ALT 22 02/01/2022 0801   ALKPHOS 62 08/12/2017 1352   BILITOT 0.5 02/01/2022 0801   BILITOT 0.4 08/12/2017 1352   GFRNONAA 95 08/12/2017 1352   GFRAA 109 08/12/2017 1352       Component Value Date/Time   WBC 13.6 (H) 08/12/2017 1352   WBC 10.9 11/29/2016 1636   RBC 4.26 08/12/2017 1352   RBC 4.03 11/29/2016 1636   HGB 14.0 08/12/2017 1352   HCT 41.8 08/12/2017 1352   PLT 347 08/12/2017 1352   MCV 98 (H) 08/12/2017 1352   MCH 32.9 08/12/2017 1352   MCH 33.1 11/29/2016 1636   MCHC 33.5 08/12/2017 1352   MCHC 33.7 11/29/2016 1636   RDW 14.2 08/12/2017 1352   LYMPHSABS 2.8 08/12/2017 1352   MONOABS 0.5 09/10/2013 1403   EOSABS 0.1 08/12/2017 1352   BASOSABS 0.0 08/12/2017 1352    No results found for: "POCLITH", "LITHIUM"   No results  found for: "PHENYTOIN", "PHENOBARB", "VALPROATE", "CBMZ"   .res Assessment: Plan:    Plan:  1. Xanax 0.5mg  daily 2. Xyprexa 5mg  - one tablet daily  RTC 6 months  Time spent with patient was 15 minutes. Greater than 50% of face to face time with patient was spent on counseling and coordination of care.    Patient advised to contact office with any questions, adverse effects, or acute worsening in signs and symptoms.  Discussed potential metabolic side effects associated with atypical antipsychotics, as well as potential  risk for movement side effects. Advised pt to contact office if movement side effects occur.   Discussed potential benefits, risk, and side effects of benzodiazepines to include potential risk of tolerance and dependence, as well as possible drowsiness. Advised patient not to drive if experiencing drowsiness and to take lowest possible effective dose to minimize risk of dependence and tolerance.  Diagnoses and all orders for this visit:  Generalized anxiety disorder -     ALPRAZolam (XANAX) 0.5 MG tablet; TAKE 1 TABLET(0.5 MG) BY MOUTH DAILY AS NEEDED FOR ANXIETY -     OLANZapine (ZYPREXA) 5 MG tablet; Take one tablet at bedtime.  Major depressive disorder, recurrent episode, moderate (HCC) -     OLANZapine (ZYPREXA) 5 MG tablet; Take one tablet at bedtime.  Bipolar I disorder (HCC) -     OLANZapine (ZYPREXA) 5 MG tablet; Take one tablet at bedtime.     Please see After Visit Summary for patient specific instructions.  Future Appointments  Date Time Provider Department Center  01/29/2023  9:20 AM Loyola Mast, MD [id number]    No orders of the defined types were placed in this encounter.   -------------------------------

## 2022-10-01 ENCOUNTER — Ambulatory Visit: Payer: BC Managed Care – PPO | Admitting: Nurse Practitioner

## 2022-10-01 ENCOUNTER — Encounter: Payer: Self-pay | Admitting: Nurse Practitioner

## 2022-10-01 VITALS — BP 96/74 | HR 83 | Temp 98.4°F | Ht 65.0 in | Wt 125.0 lb

## 2022-10-01 DIAGNOSIS — U071 COVID-19: Secondary | ICD-10-CM

## 2022-10-01 LAB — POC COVID19 BINAXNOW: SARS Coronavirus 2 Ag: POSITIVE — AB

## 2022-10-01 MED ORDER — NIRMATRELVIR/RITONAVIR (PAXLOVID) TABLET (RENAL DOSING)
2.0000 | ORAL_TABLET | Freq: Two times a day (BID) | ORAL | 0 refills | Status: AC
Start: 2022-10-01 — End: 2022-10-06

## 2022-10-01 NOTE — Progress Notes (Signed)
Acute Office Visit  Subjective:    Patient ID: Kelly Vincent, female    DOB: November 26, 1970, 52 y.o.   MRN: [id number]  Chief Complaint  Patient presents with   URI    Congestion, low grade fever, took a self test at work this morning and positive    URI  This is a new problem. The current episode started in the past 7 days. The problem has been unchanged. The maximum temperature recorded prior to her arrival was 100.4 - 100.9 F. Associated symptoms include congestion, coughing, headaches, joint pain, rhinorrhea and sinus pain. Pertinent negatives include no chest pain, diarrhea, dysuria, ear pain, joint swelling, nausea, neck pain, plugged ear sensation, rash, sneezing, sore throat, swollen glands, vomiting or wheezing. She has tried nothing for the symptoms.   Outpatient Medications Prior to Visit  Medication Sig   albuterol (VENTOLIN HFA) 108 (90 Base) MCG/ACT inhaler Inhale 2 puffs into the lungs every 6 (six) hours as needed.   ALPRAZolam (XANAX) 0.5 MG tablet TAKE 1 TABLET(0.5 MG) BY MOUTH DAILY AS NEEDED FOR ANXIETY   aspirin EC 81 MG tablet Take 1 tablet (81 mg total) by mouth daily. Swallow whole.   cetirizine (ZYRTEC) 10 MG tablet Take 10 mg by mouth as needed.   OLANZapine (ZYPREXA) 5 MG tablet Take one tablet at bedtime.   rosuvastatin (CRESTOR) 40 MG tablet Take 1 tablet (40 mg total) by mouth daily.   No facility-administered medications prior to visit.   Reviewed past medical and social history.   Review of Systems  Constitutional:  Negative for activity change, appetite change and unexpected weight change.  HENT:  Positive for congestion, rhinorrhea and sinus pain. Negative for ear pain, sneezing and sore throat.   Respiratory:  Positive for cough. Negative for wheezing.   Cardiovascular: Negative.  Negative for chest pain.  Gastrointestinal: Negative.  Negative for diarrhea, nausea and vomiting.  Endocrine: Negative for cold intolerance and heat intolerance.   Genitourinary: Negative.  Negative for dysuria.  Musculoskeletal:  Positive for joint pain. Negative for neck pain.  Skin: Negative.  Negative for rash.  Neurological:  Positive for headaches.  Hematological: Negative.   Psychiatric/Behavioral:  Negative for behavioral problems, decreased concentration, dysphoric mood, hallucinations, self-injury, sleep disturbance and suicidal ideas. The patient is not nervous/anxious.       Objective:    Physical Exam Constitutional:      General: She is not in acute distress. HENT:     Right Ear: Tympanic membrane, ear canal and external ear normal.     Left Ear: Tympanic membrane, ear canal and external ear normal.     Nose: No nasal tenderness or mucosal edema.     Right Nostril: No occlusion.     Left Nostril: No occlusion.     Right Turbinates: Not enlarged, swollen or pale.     Left Turbinates: Not enlarged, swollen or pale.     Right Sinus: No maxillary sinus tenderness or frontal sinus tenderness.     Left Sinus: No maxillary sinus tenderness or frontal sinus tenderness.     Mouth/Throat:     Pharynx: Uvula midline.     Tonsils: No tonsillar exudate or tonsillar abscesses.  Eyes:     Extraocular Movements: Extraocular movements intact.     Conjunctiva/sclera: Conjunctivae normal.  Cardiovascular:     Rate and Rhythm: Normal rate and regular rhythm.     Pulses: Normal pulses.     Heart sounds: Normal heart sounds.  Pulmonary:  Effort: Pulmonary effort is normal.     Breath sounds: Normal breath sounds.  Musculoskeletal:     Cervical back: Normal range of motion and neck supple.  Lymphadenopathy:     Cervical: No cervical adenopathy.  Neurological:     Mental Status: She is alert and oriented to person, place, and time.  Psychiatric:        Mood and Affect: Mood normal.        Behavior: Behavior normal.        Thought Content: Thought content normal.    BP 96/74 (BP Location: Left Arm, Patient Position: Sitting, Cuff Size:  Normal)   Pulse 83   Temp 98.4 F (36.9 C) (Oral)   Ht 5\' 5"  (1.651 m)   Wt 125 lb (56.7 kg)   SpO2 97%   BMI 20.80 kg/m       Latest Ref Rng & Units 08/12/2017    1:52 PM 11/29/2016    4:36 PM 11/21/2015    9:07 PM  BMP  Glucose 65 - 99 mg/dL 71  161  96   BUN 6 - 24 mg/dL 9  15  15    Creatinine 0.57 - 1.00 mg/dL 0.96  <0.45  4.09   BUN/Creat Ratio 9 - 23 12     Sodium 134 - 144 mmol/L 142  138  140   Potassium 3.5 - 5.2 mmol/L 4.8  3.5  3.7   Chloride 96 - 106 mmol/L 102  103  106   CO2 20 - 29 mmol/L 24  24  27    Calcium 8.7 - 10.2 mg/dL 9.6  9.0  9.2     No results found for any visits on 10/01/22.     Assessment & Plan:   Problem List Items Addressed This Visit   None Visit Diagnoses     COVID-19    -  Primary   Relevant Orders   POC COVID-19 BinaxNow   Renal Function Panel     Attempted to draw blood for BMP twice, but unsuccessful. Renal dose sent since no current renal function results. Advised to quit tobacco use Encourage adequate oral hydration. Use over-the-counter  cold" medicine  such as Dayquil/nyquil for cough and congestion.  Use mucinex DM or Robitussin  or delsym for cough without congestion  You can use plain "Tylenol" or "Advil" for fever, chills and achyness. Use cool mist humidifier at bedtime to help with nasal congestion and cough. Provided ED precautions and work letter.  No orders of the defined types were placed in this encounter.  Return if symptoms worsen or fail to improve.  Alysia Penna, NP

## 2022-10-01 NOTE — Patient Instructions (Addendum)
URI Instructions: Avoid decongestants if you have high blood pressure. Encourage adequate oral hydration. Use over-the-counter  cold" medicine  such as Dayquil/nyquil for cough and congestion.  Use mucinex DM or Robitussin  or delsym for cough without congestion  You can use plain "Tylenol" or "Advil" for fever, chills and achyness. Use cool mist humidifier at bedtime to help with nasal congestion and cough.  Cold/cough medications may have tylenol or ibuprofen or guaifenesin or dextromethophan in them, so be careful not to take beyond the recommended dose for each of these medications.  COVID-19 COVID-19 is an infection caused by a virus called SARS-CoV-2. This type of virus is called a coronavirus. People with COVID-19 may: Have little to no symptoms. Have mild to moderate symptoms that affect their lungs and breathing. Get very sick. What are the causes? COVID-19 is caused by a virus. This virus may be in the air as droplets or on surfaces. It can spread from an infected person when they cough, sneeze, speak, sing, or breathe. You may become infected if: You breathe in the infected droplets in the air. You touch an object that has the virus on it. What increases the risk? You are at risk of getting COVID-19 if you have been around someone with the infection. You may be more likely to get very sick if: You are 24 years old or older. You have certain medical conditions, such as: Heart disease. Diabetes. Chronic respiratory disease. Cancer. Pregnancy. You are immunocompromised. This means your body cannot fight infections easily. You have a disability or trouble moving, meaning you're immobile. What are the signs or symptoms? People may have different symptoms from COVID-19. The symptoms can also be mild to severe. They often show up in 5-6 days after being infected. But they can take up to 14 days to appear. Common symptoms are: Cough. Feeling tired. New loss of taste or  smell. Fever. Less common symptoms are: Sore throat. Headache. Body or muscle aches. Diarrhea. A skin rash or odd-colored fingers or toes. Red or irritated eyes. Sometimes, COVID-19 does not cause symptoms. How is this diagnosed? COVID-19 can be diagnosed with tests done in the lab or at home. Fluid from your nose, mouth, or lungs will be used to check for the virus. How is this treated? Treatment for COVID-19 depends on how sick you are. Mild symptoms can be treated at home with rest, fluids, and over-the-counter medicines. Severe symptoms may be treated in a hospital intensive care unit (ICU). If you have symptoms and are at risk of getting very sick, you may be given a medicine that fights viruses. This medicine is called an antiviral. How is this prevented? To protect yourself from COVID-19: Know your risk factors. Get vaccinated. If your body cannot fight infections easily, talk to your provider about treatment to help prevent COVID-19. Stay at least 1 meter away from others. Wear a well-fitted mask when: You can't stay at a distance from people. You're in a place with poor air flow. Try to be in open spaces with good air flow when in public. Wash your hands often or use an alcohol-based hand sanitizer. Cover your nose and mouth when coughing and sneezing. If you think you have COVID-19 or have been around someone who has it, stay home and be by yourself for 5-10 days. Where to find more information Centers for Disease Control and Prevention (CDC): TonerProm[employer].no World Health Organization [facility]): VisitDestination.com.br Get help right away if: You have trouble breathing or get short  of breath. You have pain or pressure in your chest. You cannot speak or move any part of your body. You are confused. Your symptoms get worse. These symptoms may be an emergency. Get help right away. Call 911. Do not wait to see if the symptoms will go away. Do not drive yourself to the hospital. This information  is not intended to replace advice given to you by your health care provider. Make sure you discuss any questions you have with your health care provider. Document Revised: 01/30/2022 Document Reviewed: 10/06/2021 Elsevier Patient Education  2024 [employer].

## 2022-11-24 ENCOUNTER — Other Ambulatory Visit: Payer: Self-pay | Admitting: Adult Health

## 2022-11-24 DIAGNOSIS — F411 Generalized anxiety disorder: Secondary | ICD-10-CM

## 2022-11-29 ENCOUNTER — Other Ambulatory Visit: Payer: Self-pay | Admitting: Cardiology

## 2022-11-29 DIAGNOSIS — I2510 Atherosclerotic heart disease of native coronary artery without angina pectoris: Secondary | ICD-10-CM

## 2022-11-29 DIAGNOSIS — E7800 Pure hypercholesterolemia, unspecified: Secondary | ICD-10-CM

## 2023-01-28 ENCOUNTER — Other Ambulatory Visit: Payer: Self-pay | Admitting: Adult Health

## 2023-01-28 ENCOUNTER — Telehealth: Payer: Self-pay | Admitting: Adult Health

## 2023-01-28 DIAGNOSIS — F331 Major depressive disorder, recurrent, moderate: Secondary | ICD-10-CM

## 2023-01-28 DIAGNOSIS — F319 Bipolar disorder, unspecified: Secondary | ICD-10-CM

## 2023-01-28 DIAGNOSIS — F411 Generalized anxiety disorder: Secondary | ICD-10-CM

## 2023-01-29 ENCOUNTER — Ambulatory Visit: Payer: BC Managed Care – PPO | Admitting: Family Medicine

## 2023-01-29 ENCOUNTER — Encounter: Payer: Self-pay | Admitting: Family Medicine

## 2023-01-29 VITALS — BP 104/66 | HR 90 | Temp 98.3°F | Ht 65.0 in | Wt 129.3 lb

## 2023-01-29 DIAGNOSIS — I2510 Atherosclerotic heart disease of native coronary artery without angina pectoris: Secondary | ICD-10-CM

## 2023-01-29 DIAGNOSIS — I2584 Coronary atherosclerosis due to calcified coronary lesion: Secondary | ICD-10-CM

## 2023-01-29 DIAGNOSIS — E785 Hyperlipidemia, unspecified: Secondary | ICD-10-CM | POA: Diagnosis not present

## 2023-01-29 DIAGNOSIS — Z23 Encounter for immunization: Secondary | ICD-10-CM | POA: Diagnosis not present

## 2023-01-29 DIAGNOSIS — F17209 Nicotine dependence, unspecified, with unspecified nicotine-induced disorders: Secondary | ICD-10-CM | POA: Diagnosis not present

## 2023-01-29 DIAGNOSIS — F319 Bipolar disorder, unspecified: Secondary | ICD-10-CM

## 2023-01-29 LAB — LIPID PANEL
Cholesterol: 125 mg/dL (ref 0–200)
HDL: 50.8 mg/dL (ref >39.00)
LDL Cholesterol: 54 mg/dL (ref 0–99)
NonHDL: 74.23
Total CHOL/HDL Ratio: 2
Triglycerides: 102 mg/dL (ref 0.0–149.0)
VLDL: 20.4 mg/dL (ref 0.0–40.0)

## 2023-01-29 MED ORDER — ROSUVASTATIN CALCIUM 40 MG PO TABS: 40.0000 mg | ORAL_TABLET | Freq: Every day | ORAL | 3 refills | Status: DC

## 2023-01-29 NOTE — Assessment & Plan Note (Signed)
Continues to smoke ~1 ppd. I continue to advise smoking cessation efforts. Due for annual LDCT for lung cancer screening.

## 2023-01-29 NOTE — Progress Notes (Signed)
[facility] [address] [place] [address] Dept: [phone] Dept Fax: [phone]  Chronic Care Office Visit  Subjective:    Patient ID: Genella Mech, female    DOB: 1970/02/24, 52 y.o..   MRN: [id number]  Chief Complaint  Patient presents with   Follow-up    6 month f/u.   No concerns.   Not fasting today.    History of Present Illness:  Patient is in today for reassessment of chronic medical issues.  Ms. Hindes has a history of generalized anxiety, major depression, and bipolar 1 disorder. She is managed by Yvette Rack, NP on olanzapine 5 mg daily, and alprazolam 0.5 mg as needed. 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  Ms. Michels continues to smoke. She has tried NRT in the past without success. She has a history of CAD noted on CT scan. She is currently managed on rosuvastatin 40 mg daily.  Past Medical History: Patient Active Problem List   Diagnosis Date Noted   Nephrolithiasis 09/15/2021   COPD (chronic obstructive pulmonary disease) (HCC) 09/14/2021   Coronary artery disease due to calcified coronary lesion 09/14/2021   Cervical intraepithelial neoplasia grade 1 09/06/2021   Generalized anxiety disorder 05/16/2021   Insomnia 05/16/2021   Bipolar I disorder (HCC) 05/16/2021   History of colon polyps 09/01/2020   Allergic rhinitis 07/26/2020   History of kidney stones 07/26/2020   Borderline hyperlipidemia 07/26/2020   Breast lump 02/19/2019   Genital herpes simplex 08/12/2017   Tobacco use disorder, continuous 08/19/2012   Major depressive disorder, recurrent (HCC) 08/19/2012   Past Surgical History:  Procedure Laterality Date   OVARIAN CYST SURGERY     rupture of cyst   Family History  Adopted: Yes  Problem Relation Age of Onset   Autism Son    Outpatient Medications Prior to Visit  Medication Sig  Dispense Refill   albuterol (VENTOLIN HFA) 108 (90 Base) MCG/ACT inhaler Inhale 2 puffs into the lungs every 6 (six) hours as needed.     ALPRAZolam (XANAX) 0.5 MG tablet TAKE 1 TABLET(0.5 MG) BY MOUTH DAILY AS NEEDED FOR ANXIETY 30 tablet 2   aspirin EC 81 MG tablet Take 1 tablet (81 mg total) by mouth daily. Swallow whole. 90 tablet 3   cetirizine (ZYRTEC) 10 MG tablet Take 10 mg by mouth as needed.     OLANZapine (ZYPREXA) 5 MG tablet TAKE 1 TABLET BY MOUTH AT BEDTIME 30 tablet 0   rosuvastatin (CRESTOR) 40 MG tablet TAKE 1 TABLET(40 MG) BY MOUTH DAILY 90 tablet 0   No facility-administered medications prior to visit.   Allergies  Allergen Reactions   Codeine Nausea And Vomiting   Short Ragweed Pollen Ext    Sulfa Antibiotics Hives, Rash and Itching   Objective:   Today's Vitals   01/29/23 0926  BP: 104/66  Pulse: 90  Temp: 98.3 F (36.8 C)  TempSrc: Temporal  SpO2: 97%  Weight: 129 lb 4.8 oz (58.7 kg)  Height: 5\' 5"  (1.651 m)   Body mass index is 21.52 kg/m.   General: Well developed, well nourished. No acute distress. Psych: Alert and oriented. Normal mood and affect.  Health Maintenance Due  Topic Date Due   Cervical Cancer Screening (HPV/Pap Cotest)  Never done   Zoster Vaccines- Shingrix (1 of 2) Never done   MAMMOGRAM  12/02/2021   DTaP/Tdap/Td (2 -  Td or Tdap) 08/20/2022   INFLUENZA VACCINE  09/06/2022   Lung Cancer Screening  09/14/2022      01/29/2023    9:24 AM 07/30/2022   10:02 AM 07/30/2022   10:00 AM  Depression screen PHQ 2/9  Decreased Interest 1 0 0  Down, Depressed, Hopeless 3 1 1   PHQ - 2 Score 4 1 1   Altered sleeping 0 0   Tired, decreased energy 3 1   Change in appetite 0 0   Feeling bad or failure about yourself  1 1   Trouble concentrating 1 0   Moving slowly or fidgety/restless 0 0   Suicidal thoughts 0 0   PHQ-9 Score 9 3   Difficult doing work/chores Somewhat difficult Not difficult at all      Assessment & Plan:   Problem  List Items Addressed This Visit       Cardiovascular and Mediastinum   Coronary artery disease due to calcified coronary lesion   Continue to recommend smoking cessation and lipid management.      Relevant Medications   rosuvastatin (CRESTOR) 40 MG tablet   Other Relevant Orders   Lipid panel     Other   Bipolar I disorder (HCC)   Continue to follow with behavioral health. Continue olanzapine 5 mg daily, and alprazolam 0.5 mg as needed.      Borderline hyperlipidemia - Primary   I will reassess lipids today. Continue rosuvastatin 40 mg daily.      Relevant Medications   rosuvastatin (CRESTOR) 40 MG tablet   Other Relevant Orders   Lipid panel   Tobacco use disorder, continuous   Continues to smoke ~1 ppd. I continue to advise smoking cessation efforts. Due for annual LDCT for lung cancer screening.       Relevant Orders   CT CHEST LUNG CANCER SCREENING LOW DOSE WO CONTRAST   Other Visit Diagnoses       Need for immunization against influenza       Relevant Orders   Flu vaccine trivalent PF, 6mos and older(Flulaval,Afluria,Fluarix,Fluzone) (Completed)       Return in about 6 months (around 07/30/2023) for Reassessment.   Loyola Mast, MD

## 2023-01-29 NOTE — Assessment & Plan Note (Signed)
Continue to recommend smoking cessation and lipid management.

## 2023-01-29 NOTE — Assessment & Plan Note (Signed)
Continue to follow with behavioral health. Continue olanzapine 5 mg daily, and alprazolam 0.5 mg as needed.

## 2023-01-29 NOTE — Assessment & Plan Note (Signed)
I will reassess lipids today. Continue rosuvastatin 40 mg daily.

## 2023-02-01 ENCOUNTER — Telehealth: Payer: BC Managed Care – PPO | Admitting: Adult Health

## 2023-02-01 ENCOUNTER — Encounter: Payer: Self-pay | Admitting: Adult Health

## 2023-02-01 DIAGNOSIS — F319 Bipolar disorder, unspecified: Secondary | ICD-10-CM

## 2023-02-01 DIAGNOSIS — F411 Generalized anxiety disorder: Secondary | ICD-10-CM

## 2023-02-01 DIAGNOSIS — F331 Major depressive disorder, recurrent, moderate: Secondary | ICD-10-CM

## 2023-02-01 MED ORDER — OLANZAPINE 5 MG PO TABS: 5.0000 mg | ORAL_TABLET | Freq: Every day | ORAL | 1 refills | Status: DC

## 2023-02-01 MED ORDER — ALPRAZOLAM 0.5 MG PO TABS: ORAL_TABLET | ORAL | 2 refills | Status: DC

## 2023-02-01 NOTE — Telephone Encounter (Signed)
error 

## 2023-02-01 NOTE — Progress Notes (Signed)
Kelly Vincent [id number] 07-16-1970 52 y.o.  Virtual Visit via Video Note  I connected with pt @ on 02/01/23 at  9:40 AM EST by a video enabled telemedicine application and verified that I am speaking with the correct person using two identifiers.   I discussed the limitations of evaluation and management by telemedicine and the availability of in person appointments. The patient expressed understanding and agreed to proceed.  I discussed the assessment and treatment plan with the patient. The patient was provided an opportunity to ask questions and all were answered. The patient agreed with the plan and demonstrated an understanding of the instructions.   The patient was advised to call back or seek an in-person evaluation if the symptoms worsen or if the condition fails to improve as anticipated.  I provided 15 minutes of non-face-to-face time during this encounter.  The patient was located at home.  The provider was located at [facility].   Dorothyann Gibbs, NP   Subjective:   Patient ID:  Kelly Vincent is a 52 y.o. (DOB May 20, 1970) female.  Chief Complaint: No chief complaint on file.   HPI Kelly Vincent presents for follow-up of GAD, MDD and BPD 1.  Describes mood today as "ok". Pleasant. Denies tearfulness. Mood symptoms - reports depression, anxiety and irritability at times. Reports worry, rumination, and over thinking. Reports panic attacks. Reporting ongoing situational stressors. Mood is lower. Stating "I'm hanging in there". Feels like current medication regimen is helpful.  Living with boyfriend - son staying at her house. Stable interest and motivation. Taking medications as prescribed. Has started working with a therapist. Energy levels lower. Active, does not have a regular exercise routine.  Enjoys some usual interests and activities. Lives with boyfriend - son local. Appetite adequate. Weight loss - 128 pounds. Sleeps well most nights. Averages 6  hours - waking up during the night - stress. Focus and concentration stable. Completing tasks. Managing aspects of household. Works full time - Pension scheme manager.   Denies SI or HI.  Denies AH or VH. Denies self harm. Denies substance use.  Previous medications: Depakote, Lexapro  Review of Systems:  Review of Systems  Musculoskeletal:  Negative for gait problem.  Neurological:  Negative for tremors.  Psychiatric/Behavioral:         Please refer to HPI    Medications: I have reviewed the patient's current medications.  Current Outpatient Medications  Medication Sig Dispense Refill   albuterol (VENTOLIN HFA) 108 (90 Base) MCG/ACT inhaler Inhale 2 puffs into the lungs every 6 (six) hours as needed.     ALPRAZolam (XANAX) 0.5 MG tablet TAKE 1 TABLET(0.5 MG) BY MOUTH DAILY AS NEEDED FOR ANXIETY 30 tablet 2   aspirin EC 81 MG tablet Take 1 tablet (81 mg total) by mouth daily. Swallow whole. 90 tablet 3   cetirizine (ZYRTEC) 10 MG tablet Take 10 mg by mouth as needed.     OLANZapine (ZYPREXA) 5 MG tablet TAKE 1 TABLET BY MOUTH AT BEDTIME 30 tablet 0   rosuvastatin (CRESTOR) 40 MG tablet Take 1 tablet (40 mg total) by mouth daily. 90 tablet 3   No current facility-administered medications for this visit.    Medication Side Effects: None  Allergies:  Allergies  Allergen Reactions   Codeine Nausea And Vomiting   Short Ragweed Pollen Ext    Sulfa Antibiotics Hives, Rash and Itching    Past Medical History:  Diagnosis Date   Allergy    Anxiety  Arthritis    Coronary artery calcification    Herpes    Hyperlipidemia    Palpitations     Family History  Adopted: Yes  Problem Relation Age of Onset   Autism Son     Social History   Socioeconomic History   Marital status: Single    Spouse name: Not on file   Number of children: 1   Years of education: Not on file   Highest education level: Bachelor's degree (e.g., BA, AB, BS)  Occupational History    Occupation: special Veterinary surgeon: [employer]  Tobacco Use   Smoking status: Every Day    Current packs/day: 1.00    Average packs/day: 1 pack/day for 30.0 years (30.0 ttl pk-yrs)    Types: Cigarettes   Smokeless tobacco: Never  Vaping Use   Vaping status: Never Used  Substance and Sexual Activity   Alcohol use: Yes    Alcohol/week: 2.0 standard drinks of alcohol    Types: 2 Shots of liquor per week    Comment: Occasional   Drug use: No   Sexual activity: Yes    Comment: Fiancee  Other Topics Concern   Not on file  Social History Narrative   Significant other   College education   Works as a Holiday representative   Social Drivers of Corporate investment banker Strain: Not on file  Food Insecurity: Not on file  Transportation Needs: Not on file  Physical Activity: Not on file  Stress: Not on file  Social Connections: Not on file  Intimate Partner Violence: Not on file    Past Medical History, Surgical history, Social history, and Family history were reviewed and updated as appropriate.   Please see review of systems for further details on the patient's review from today.   Objective:   Physical Exam:  There were no vitals taken for this visit.  Physical Exam Constitutional:      General: She is not in acute distress. Musculoskeletal:        General: No deformity.  Neurological:     Mental Status: She is alert and oriented to person, place, and time.     Coordination: Coordination normal.  Psychiatric:        Attention and Perception: Attention and perception normal. She does not perceive auditory or visual hallucinations.        Mood and Affect: Affect is not labile, blunt, angry or inappropriate.        Speech: Speech normal.        Behavior: Behavior normal.        Thought Content: Thought content normal. Thought content is not paranoid or delusional. Thought content does not include homicidal or  suicidal ideation. Thought content does not include homicidal or suicidal plan.        Cognition and Memory: Cognition and memory normal.        Judgment: Judgment normal.     Comments: Insight intact     Lab Review:     Component Value Date/Time   NA 142 08/12/2017 1352   K 4.8 08/12/2017 1352   CL 102 08/12/2017 1352   CO2 24 08/12/2017 1352   GLUCOSE 71 08/12/2017 1352   GLUCOSE 102 (H) 11/29/2016 1636   BUN 9 08/12/2017 1352   CREATININE 0.75 08/12/2017 1352   CREATININE 0.76 09/10/2013 1403   CALCIUM 9.6 08/12/2017 1352   PROT 6.9 02/01/2022 0801   PROT 7.0  08/12/2017 1352   ALBUMIN 4.4 08/12/2017 1352   AST 18 02/01/2022 0801   ALT 22 02/01/2022 0801   ALKPHOS 62 08/12/2017 1352   BILITOT 0.5 02/01/2022 0801   BILITOT 0.4 08/12/2017 1352   GFRNONAA 95 08/12/2017 1352   GFRAA 109 08/12/2017 1352       Component Value Date/Time   WBC 13.6 (H) 08/12/2017 1352   WBC 10.9 11/29/2016 1636   RBC 4.26 08/12/2017 1352   RBC 4.03 11/29/2016 1636   HGB 14.0 08/12/2017 1352   HCT 41.8 08/12/2017 1352   PLT 347 08/12/2017 1352   MCV 98 (H) 08/12/2017 1352   MCH 32.9 08/12/2017 1352   MCH 33.1 11/29/2016 1636   MCHC 33.5 08/12/2017 1352   MCHC 33.7 11/29/2016 1636   RDW 14.2 08/12/2017 1352   LYMPHSABS 2.8 08/12/2017 1352   MONOABS 0.5 09/10/2013 1403   EOSABS 0.1 08/12/2017 1352   BASOSABS 0.0 08/12/2017 1352    No results found for: "POCLITH", "LITHIUM"   No results found for: "PHENYTOIN", "PHENOBARB", "VALPROATE", "CBMZ"   .res Assessment: Plan:    Assessment: Plan:    Plan:  1. Xanax 0.5mg  daily 2. Xyprexa 5mg  - one tablet daily  RTC 6 months  Time spent with patient was 15 minutes. Greater than 50% of face to face time with patient was spent on counseling and coordination of care.    Patient advised to contact office with any questions, adverse effects, or acute worsening in signs and symptoms.  Discussed potential metabolic side effects  associated with atypical antipsychotics, as well as potential risk for movement side effects. Advised pt to contact office if movement side effects occur.   Discussed potential benefits, risk, and side effects of benzodiazepines to include potential risk of tolerance and dependence, as well as possible drowsiness. Advised patient not to drive if experiencing drowsiness and to take lowest possible effective dose to minimize risk of dependence and tolerance.  There are no diagnoses linked to this encounter.   Please see After Visit Summary for patient specific instructions.  Future Appointments  Date Time Provider Department Center  02/01/2023  9:40 AM Liliann File, Thereasa Solo, NP [facility] None  02/22/2023  4:30 PM GI[facility] 1 [facility] [address] [facility]    No orders of the defined types were placed in this encounter.     -------------------------------

## 2023-02-22 ENCOUNTER — Other Ambulatory Visit: Payer: BC Managed Care – PPO

## 2023-05-15 LAB — HM MAMMOGRAPHY

## 2023-07-09 ENCOUNTER — Encounter: Payer: Self-pay | Admitting: Family Medicine

## 2023-07-10 NOTE — Progress Notes (Signed)
 HPI: FU CAD. Echocardiogram December 2017 showed normal LV function, trivial tricuspid regurgitation. Exercise treadmill December 2017 apparently normal as well. Chest CT August 2023 showed coronary calcification including left main and three-vessel disease.  Nuclear study December 2023 showed no ischemia or infarction and ejection fraction 65%.  Significant RV uptake noted.  Since last seen the patient denies any dyspnea on exertion, orthopnea, PND, pedal edema, palpitations, syncope or chest pain.   Current Outpatient Medications  Medication Sig Dispense Refill   ALPRAZolam  (XANAX ) 0.5 MG tablet TAKE 1 TABLET(0.5 MG) BY MOUTH DAILY AS NEEDED FOR ANXIETY (Patient taking differently: TAKE 1 TABLET(0.5 MG) BY MOUTH DAILY AS NEEDED FOR ANXIETY) 30 tablet 2   aspirin  EC 81 MG tablet Take 1 tablet (81 mg total) by mouth daily. Swallow whole. 90 tablet 3   cetirizine (ZYRTEC) 10 MG tablet Take 10 mg by mouth as needed.     OLANZapine  (ZYPREXA ) 5 MG tablet Take 1 tablet (5 mg total) by mouth at bedtime. 90 tablet 1   rosuvastatin  (CRESTOR ) 40 MG tablet Take 1 tablet (40 mg total) by mouth daily. 90 tablet 3   albuterol (VENTOLIN HFA) 108 (90 Base) MCG/ACT inhaler Inhale 2 puffs into the lungs every 6 (six) hours as needed. (Patient not taking: Reported on 07/23/2023)     No current facility-administered medications for this visit.     Past Medical History:  Diagnosis Date   Allergy    Anxiety    Arthritis    Coronary artery calcification    Herpes    Hyperlipidemia    Palpitations     Past Surgical History:  Procedure Laterality Date   OVARIAN CYST SURGERY     rupture of cyst    Social History   Socioeconomic History   Marital status: Single    Spouse name: Not on file   Number of children: 1   Years of education: Not on file   Highest education level: Bachelor's degree (e.g., BA, AB, BS)  Occupational History   Occupation: special Veterinary surgeon: [employer]  Tobacco Use   Smoking status: Every Day    Current packs/day: 1.00    Average packs/day: 1 pack/day for 30.0 years (30.0 ttl pk-yrs)    Types: Cigarettes   Smokeless tobacco: Never   Tobacco comments:    07/23/2023 Patient smokews about 15 cigarettes daily when not in school  Vaping Use   Vaping status: Never Used  Substance and Sexual Activity   Alcohol use: Yes    Alcohol/week: 2.0 standard drinks of alcohol    Types: 2 Shots of liquor per week    Comment: Occasional   Drug use: No   Sexual activity: Yes    Comment: Fiancee  Other Topics Concern   Not on file  Social History Narrative   Significant other   College education   Works as a Holiday representative   Social Drivers of Corporate investment banker Strain: Not on file  Food Insecurity: Not on file  Transportation Needs: Not on file  Physical Activity: Not on file  Stress: Not on file  Social Connections: Not on file  Intimate Partner Violence: Not on file    Family History  Adopted: Yes  Problem Relation Age of Onset   Autism Son     ROS: no fevers or chills, productive cough, hemoptysis, dysphasia, odynophagia, melena, hematochezia, dysuria, hematuria, rash, seizure activity, orthopnea, PND,  pedal edema, claudication. Remaining systems are negative.  Physical Exam: Well-developed well-nourished in no acute distress.  Skin is warm and dry.  HEENT is normal.  Neck is supple.  Chest is clear to auscultation with normal expansion.  Cardiovascular exam is regular rate and rhythm.  Abdominal exam nontender or distended. No masses palpated. Extremities show no edema. neuro grossly intact  EKG Interpretation Date/Time:  Tuesday July 23 2023 10:10:22 EDT Ventricular Rate:  73 PR Interval:  160 QRS Duration:  74 QT Interval:  398 QTC Calculation: 438 R Axis:   61  Text Interpretation: Normal sinus rhythm Normal ECG Confirmed by Alexandria Angel ([id number]) on  07/23/2023 10:13:10 AM    A/P  1 coronary calcification-continue aspirin  and statin.  Patient is not having chest pain.  Follow-up nuclear study showed no ischemia.  Plan medical therapy.  2 hyperlipidemia-continue statin.  Check lipids and liver.  3 tobacco abuse-patient counseled on discontinuing.  Alexandria Angel, MD

## 2023-07-12 ENCOUNTER — Ambulatory Visit
Admission: RE | Admit: 2023-07-12 | Discharge: 2023-07-12 | Disposition: A | Discharge status: Home / Self Care | Source: Ambulatory Visit | Attending: Family Medicine | Admitting: Family Medicine

## 2023-07-12 DIAGNOSIS — F17209 Nicotine dependence, unspecified, with unspecified nicotine-induced disorders: Secondary | ICD-10-CM

## 2023-07-23 ENCOUNTER — Telehealth: Payer: Self-pay | Admitting: Adult Health

## 2023-07-23 ENCOUNTER — Other Ambulatory Visit: Payer: Self-pay

## 2023-07-23 ENCOUNTER — Ambulatory Visit: Attending: Cardiology | Admitting: Cardiology

## 2023-07-23 ENCOUNTER — Encounter: Payer: Self-pay | Admitting: Cardiology

## 2023-07-23 VITALS — BP 94/70 | HR 73 | Ht 65.5 in | Wt 124.4 lb

## 2023-07-23 DIAGNOSIS — I2510 Atherosclerotic heart disease of native coronary artery without angina pectoris: Secondary | ICD-10-CM | POA: Diagnosis not present

## 2023-07-23 DIAGNOSIS — E7800 Pure hypercholesterolemia, unspecified: Secondary | ICD-10-CM | POA: Diagnosis not present

## 2023-07-23 DIAGNOSIS — I2584 Coronary atherosclerosis due to calcified coronary lesion: Secondary | ICD-10-CM

## 2023-07-23 DIAGNOSIS — F411 Generalized anxiety disorder: Secondary | ICD-10-CM

## 2023-07-23 DIAGNOSIS — F17209 Nicotine dependence, unspecified, with unspecified nicotine-induced disorders: Secondary | ICD-10-CM | POA: Diagnosis not present

## 2023-07-23 NOTE — Patient Instructions (Signed)

## 2023-07-23 NOTE — Telephone Encounter (Signed)
 Pended alprazolam to [facility]

## 2023-07-23 NOTE — Telephone Encounter (Signed)
 Next visit is 08/06/23. Requesting refill on Alprazolam  called to  [id number] Jonette Nestle, [place] - [address] AT [facility] [address]   Phone: [phone]  Fax: [phone]

## 2023-07-24 ENCOUNTER — Ambulatory Visit: Payer: Self-pay | Admitting: Cardiology

## 2023-07-24 LAB — HEPATIC FUNCTION PANEL
ALT: 16 IU/L (ref 0–32)
AST: 23 IU/L (ref 0–40)
Albumin: 4.7 g/dL (ref 3.8–4.9)
Alkaline Phosphatase: 91 IU/L (ref 44–121)
Bilirubin Total: 0.4 mg/dL (ref 0.0–1.2)
Bilirubin, Direct: 0.16 mg/dL (ref 0.00–0.40)
Total Protein: 6.8 g/dL (ref 6.0–8.5)

## 2023-07-24 LAB — LIPID PANEL
Chol/HDL Ratio: 2.3 ratio (ref 0.0–4.4)
Cholesterol, Total: 117 mg/dL (ref 100–199)
HDL: 50 mg/dL (ref >39)
LDL Chol Calc (NIH): 51 mg/dL (ref 0–99)
Triglycerides: 78 mg/dL (ref 0–149)
VLDL Cholesterol Cal: 16 mg/dL (ref 5–40)

## 2023-07-24 MED ORDER — ALPRAZOLAM 0.5 MG PO TABS: ORAL_TABLET | ORAL | 0 refills | Status: DC

## 2023-07-29 ENCOUNTER — Encounter: Payer: Self-pay | Admitting: Family Medicine

## 2023-07-29 ENCOUNTER — Ambulatory Visit: Payer: Self-pay | Admitting: Family Medicine

## 2023-07-29 DIAGNOSIS — I700 Atherosclerosis of aorta: Secondary | ICD-10-CM | POA: Insufficient documentation

## 2023-08-06 ENCOUNTER — Encounter: Payer: Self-pay | Admitting: Adult Health

## 2023-08-06 ENCOUNTER — Ambulatory Visit (INDEPENDENT_AMBULATORY_CARE_PROVIDER_SITE_OTHER): Payer: Self-pay | Admitting: Adult Health

## 2023-08-06 DIAGNOSIS — F331 Major depressive disorder, recurrent, moderate: Secondary | ICD-10-CM | POA: Diagnosis not present

## 2023-08-06 DIAGNOSIS — F411 Generalized anxiety disorder: Secondary | ICD-10-CM

## 2023-08-06 DIAGNOSIS — F319 Bipolar disorder, unspecified: Secondary | ICD-10-CM

## 2023-08-06 MED ORDER — ALPRAZOLAM 0.5 MG PO TABS: ORAL_TABLET | ORAL | 2 refills | Status: DC

## 2023-08-06 MED ORDER — OLANZAPINE 5 MG PO TABS: 5.0000 mg | ORAL_TABLET | Freq: Every day | ORAL | 1 refills | Status: DC

## 2023-08-06 NOTE — Progress Notes (Signed)
 Kelly Vincent [id number] September 24, 1970 53 y.o.  Virtual Visit via Telephone Note  I connected with pt on 08/06/23 at  8:30 AM EDT by telephone and verified that I am speaking with the correct person using two identifiers.   I discussed the limitations, risks, security and privacy concerns of performing an evaluation and management service by telephone and the availability of in person appointments. I also discussed with the patient that there may be a patient responsible charge related to this service. The patient expressed understanding and agreed to proceed.   I discussed the assessment and treatment plan with the patient. The patient was provided an opportunity to ask questions and all were answered. The patient agreed with the plan and demonstrated an understanding of the instructions.   The patient was advised to call back or seek an in-person evaluation if the symptoms worsen or if the condition fails to improve as anticipated.  I provided 20 minutes of non-face-to-face time during this encounter.  The patient was located at home.  The provider was located at [facility].   Angeline LOISE Sayers, NP   Subjective:   Patient ID:  Kelly Vincent is a 53 y.o. (DOB March 27, 1970) female.  Chief Complaint: No chief complaint on file.   HPI Kelly Vincent presents for follow-up of GAD, MDD and BPD 1.  Describes mood today as ok. Pleasant. Denies tearfulness. Mood symptoms - denies depression, anxiety and irritability. Reports stable interest and motivation. Denies worry, rumination and over thinking. Reports some obsessive thoughts and acts. Denies panic attacks. Reporting decreased situational stressors with son. Reports mood is stable. Stating I feel like I'm doing ok. Feels like current medication regimen is helpful.   Taking medications as prescribed. Energy levels improved. Active, does not have a regular exercise routine.  Enjoys some usual interests and activities. Lives  with boyfriend - son local. Appetite adequate. Weight loss - 125 from 128 pounds. Sleeps well most nights. Averages 7 hours. Focus and concentration stable. Completing tasks. Managing aspects of household. Works full time - Pension scheme manager.   Denies SI or HI.  Denies AH or VH. Denies self harm. Denies substance use.  Previous medications: Depakote, Lexapro   Review of Systems:  Review of Systems  Musculoskeletal:  Negative for gait problem.  Neurological:  Negative for tremors.  Psychiatric/Behavioral:         Please refer to HPI    Medications: I have reviewed the patient's current medications.  Current Outpatient Medications  Medication Sig Dispense Refill   albuterol (VENTOLIN HFA) 108 (90 Base) MCG/ACT inhaler Inhale 2 puffs into the lungs every 6 (six) hours as needed. (Patient not taking: Reported on 07/23/2023)     ALPRAZolam  (XANAX ) 0.5 MG tablet TAKE 1 TABLET(0.5 MG) BY MOUTH DAILY AS NEEDED FOR ANXIETY 30 tablet 0   aspirin  EC 81 MG tablet Take 1 tablet (81 mg total) by mouth daily. Swallow whole. 90 tablet 3   cetirizine (ZYRTEC) 10 MG tablet Take 10 mg by mouth as needed.     OLANZapine  (ZYPREXA ) 5 MG tablet Take 1 tablet (5 mg total) by mouth at bedtime. 90 tablet 1   rosuvastatin  (CRESTOR ) 40 MG tablet Take 1 tablet (40 mg total) by mouth daily. 90 tablet 3   No current facility-administered medications for this visit.    Medication Side Effects: None  Allergies:  Allergies  Allergen Reactions   Codeine Nausea And Vomiting   Short Ragweed Pollen Ext    Sulfa Antibiotics  Hives, Rash and Itching    Past Medical History:  Diagnosis Date   Allergy    Anxiety    Arthritis    Coronary artery calcification    Herpes    Hyperlipidemia    Palpitations     Family History  Adopted: Yes  Problem Relation Age of Onset   Autism Son     Social History   Socioeconomic History   Marital status: Single    Spouse name: Not on file   Number of  children: 1   Years of education: Not on file   Highest education level: Bachelor's degree (e.g., BA, AB, BS)  Occupational History   Occupation: special Veterinary surgeon: [employer]  Tobacco Use   Smoking status: Every Day    Current packs/day: 1.00    Average packs/day: 1 pack/day for 30.0 years (30.0 ttl pk-yrs)    Types: Cigarettes   Smokeless tobacco: Never   Tobacco comments:    07/23/2023 Patient smokews about 15 cigarettes daily when not in school  Vaping Use   Vaping status: Never Used  Substance and Sexual Activity   Alcohol use: Yes    Alcohol/week: 2.0 standard drinks of alcohol    Types: 2 Shots of liquor per week    Comment: Occasional   Drug use: No   Sexual activity: Yes    Comment: Fiancee  Other Topics Concern   Not on file  Social History Narrative   Significant other   College education   Works as a Holiday representative   Social Drivers of Corporate investment banker Strain: Not on file  Food Insecurity: Not on file  Transportation Needs: Not on file  Physical Activity: Not on file  Stress: Not on file  Social Connections: Not on file  Intimate Partner Violence: Not on file    Past Medical History, Surgical history, Social history, and Family history were reviewed and updated as appropriate.   Please see review of systems for further details on the patient's review from today.   Objective:   Physical Exam:  There were no vitals taken for this visit.  Physical Exam Constitutional:      General: She is not in acute distress.  Musculoskeletal:        General: No deformity.   Neurological:     Mental Status: She is alert and oriented to person, place, and time.     Coordination: Coordination normal.   Psychiatric:        Attention and Perception: Attention and perception normal. She does not perceive auditory or visual hallucinations.        Mood and Affect: Mood normal. Mood is not  anxious or depressed. Affect is not labile, blunt, angry or inappropriate.        Speech: Speech normal.        Behavior: Behavior normal.        Thought Content: Thought content normal. Thought content is not paranoid or delusional. Thought content does not include homicidal or suicidal ideation. Thought content does not include homicidal or suicidal plan.        Cognition and Memory: Cognition and memory normal.        Judgment: Judgment normal.     Comments: Insight intact     Lab Review:     Component Value Date/Time   NA 142 08/12/2017 1352   K 4.8 08/12/2017 1352   CL 102 08/12/2017 1352  CO2 24 08/12/2017 1352   GLUCOSE 71 08/12/2017 1352   GLUCOSE 102 (H) 11/29/2016 1636   BUN 9 08/12/2017 1352   CREATININE 0.75 08/12/2017 1352   CREATININE 0.76 09/10/2013 1403   CALCIUM  9.6 08/12/2017 1352   PROT 6.8 07/23/2023 1040   ALBUMIN 4.7 07/23/2023 1040   AST 23 07/23/2023 1040   ALT 16 07/23/2023 1040   ALKPHOS 91 07/23/2023 1040   BILITOT 0.4 07/23/2023 1040   GFRNONAA 95 08/12/2017 1352   GFRAA 109 08/12/2017 1352       Component Value Date/Time   WBC 13.6 (H) 08/12/2017 1352   WBC 10.9 11/29/2016 1636   RBC 4.26 08/12/2017 1352   RBC 4.03 11/29/2016 1636   HGB 14.0 08/12/2017 1352   HCT 41.8 08/12/2017 1352   PLT 347 08/12/2017 1352   MCV 98 (H) 08/12/2017 1352   MCH 32.9 08/12/2017 1352   MCH 33.1 11/29/2016 1636   MCHC 33.5 08/12/2017 1352   MCHC 33.7 11/29/2016 1636   RDW 14.2 08/12/2017 1352   LYMPHSABS 2.8 08/12/2017 1352   MONOABS 0.5 09/10/2013 1403   EOSABS 0.1 08/12/2017 1352   BASOSABS 0.0 08/12/2017 1352    No results found for: POCLITH, LITHIUM   No results found for: PHENYTOIN, PHENOBARB, VALPROATE, CBMZ   .res Assessment: Plan:    Plan:  1. Xanax  0.5mg  daily 2. Xyprexa 5mg  - one tablet daily  RTC 6 months  20 minutes spent dedicated to the care of this patient on the date of this encounter to include pre-visit review  of records, ordering of medication, post visit documentation, and face-to-face time with the patient discussing GAD, MDD and BPD 1. Discussed continuing current medication regimen.  Patient advised to contact office with any questions, adverse effects, or acute worsening in signs and symptoms.  Discussed potential metabolic side effects associated with atypical antipsychotics, as well as potential risk for movement side effects. Advised pt to contact office if movement side effects occur.   Discussed potential benefits, risk, and side effects of benzodiazepines to include potential risk of tolerance and dependence, as well as possible drowsiness. Advised patient not to drive if experiencing drowsiness and to take lowest possible effective dose to minimize risk of dependence and tolerance.  There are no diagnoses linked to this encounter.   Please see After Visit Summary for patient specific instructions.  Future Appointments  Date Time Provider Department Center  08/06/2023  8:30 AM Maico Mulvehill Nattalie, NP [facility] None    No orders of the defined types were placed in this encounter.     -------------------------------

## 2024-01-21 ENCOUNTER — Encounter: Payer: Self-pay | Admitting: Adult Health

## 2024-01-21 ENCOUNTER — Ambulatory Visit: Admitting: Adult Health

## 2024-01-21 DIAGNOSIS — F411 Generalized anxiety disorder: Secondary | ICD-10-CM

## 2024-01-21 DIAGNOSIS — F319 Bipolar disorder, unspecified: Secondary | ICD-10-CM | POA: Diagnosis not present

## 2024-01-21 DIAGNOSIS — F331 Major depressive disorder, recurrent, moderate: Secondary | ICD-10-CM

## 2024-01-21 NOTE — Progress Notes (Unsigned)
 Kelly Vincent [id number] 1970-11-21 53 y.o.  Subjective:   Patient ID:  Kelly Vincent is a 53 y.o. (DOB 11-18-1970) female.  Chief Complaint: No chief complaint on file.   HPI KARELLY DEWALT presents to the office today for follow-up of GAD, MDD and BPD 1.  Describes mood today as ok. Pleasant. Denies tearfulness. Mood symptoms - reports depression, anxiety and irritability. Reports lower interest and motivation. Denies worry, rumination and over thinking. Reports some obsessive thoughts, not acts. Denies panic attacks. Reporting increased situational stressors with son and work. Reports mood is lower. Stating I feel like I'm doing ok. Feels like current medication regimen is helpful. Taking medications as prescribed. Energy levels improved. Active, does not have a regular exercise routine.  Enjoys some usual interests and activities. Lives with boyfriend - son local. Appetite adequate. Weight stable - 125 pounds. Sleeps well most nights. Averages 7 hours. Focus and concentration stable. Completing tasks. Managing aspects of household. Works full time - pension scheme manager.   Denies SI or HI.  Denies AH or VH. Denies self harm. Denies substance use.  Previous medications: Depakote, Lexapro    GAD-7    Flowsheet Row Office Visit from 05/16/2021 in [facility] Visit from 08/24/2017 in Primary Care at [facility] Visit from 08/12/2017 in Primary Care at [facility]  Total GAD-7 Score 15 8 14    PHQ2-9    Flowsheet Row Office Visit from 01/29/2023 in [facility] Visit from 07/30/2022 in [facility] Visit from 05[facility]16/2021 in [facility] Visit from 07/26/2020 in [facility] from 09/30/2018 in Primary Care at [facility] Total Score 4 1 4 1  0  PHQ-9  Total Score 9 3 14  -- --   Flowsheet Row UC from 01/13/2022 in [facility] RISK CATEGORY No Risk     Review of Systems:  Review of Systems  Musculoskeletal:  Negative for gait problem.  Neurological:  Negative for tremors.  Psychiatric/Behavioral:         Please refer to HPI    Medications: {medication reviewed/display:3041432}  Current Outpatient Medications  Medication Sig Dispense Refill   albuterol (VENTOLIN HFA) 108 (90 Base) MCG/ACT inhaler Inhale 2 puffs into the lungs every 6 (six) hours as needed. (Patient not taking: Reported on 07/23/2023)     ALPRAZolam  (XANAX ) 0.5 MG tablet TAKE 1 TABLET(0.5 MG) BY MOUTH DAILY AS NEEDED FOR ANXIETY 30 tablet 2   aspirin  EC 81 MG tablet Take 1 tablet (81 mg total) by mouth daily. Swallow whole. 90 tablet 3   cetirizine (ZYRTEC) 10 MG tablet Take 10 mg by mouth as needed.     OLANZapine  (ZYPREXA ) 5 MG tablet Take 1 tablet (5 mg total) by mouth at bedtime. 90 tablet 1   rosuvastatin  (CRESTOR ) 40 MG tablet Take 1 tablet (40 mg total) by mouth daily. 90 tablet 3   No current facility-administered medications for this visit.    Medication Side Effects: {Medication Side Effects (Optional):21014029}  Allergies: Allergies[1]  Past Medical History:  Diagnosis Date   Allergy    Anxiety    Arthritis    Coronary artery calcification    Herpes    Hyperlipidemia    Palpitations     Past Medical History, Surgical history, Social history, and Family history were reviewed and updated as  appropriate.   Please see review of systems for further details on the patient's review from today.   Objective:   Physical Exam:  There were no vitals taken for this visit.  Physical Exam Constitutional:      General: She is not in acute distress. Musculoskeletal:        General: No deformity.  Neurological:     Mental Status: She is alert and oriented to person, place, and time.     Coordination:  Coordination normal.  Psychiatric:        Attention and Perception: Attention and perception normal. She does not perceive auditory or visual hallucinations.        Mood and Affect: Mood normal. Mood is not anxious or depressed. Affect is not labile, blunt, angry or inappropriate.        Speech: Speech normal.        Behavior: Behavior normal.        Thought Content: Thought content normal. Thought content is not paranoid or delusional. Thought content does not include homicidal or suicidal ideation. Thought content does not include homicidal or suicidal plan.        Cognition and Memory: Cognition and memory normal.        Judgment: Judgment normal.     Comments: Insight intact     Lab Review:     Component Value Date/Time   NA 142 08/12/2017 1352   K 4.8 08/12/2017 1352   CL 102 08/12/2017 1352   CO2 24 08/12/2017 1352   GLUCOSE 71 08/12/2017 1352   GLUCOSE 102 (H) 11/29/2016 1636   BUN 9 08/12/2017 1352   CREATININE 0.75 08/12/2017 1352   CREATININE 0.76 09/10/2013 1403   CALCIUM  9.6 08/12/2017 1352   PROT 6.8 07/23/2023 1040   ALBUMIN 4.7 07/23/2023 1040   AST 23 07/23/2023 1040   ALT 16 07/23/2023 1040   ALKPHOS 91 07/23/2023 1040   BILITOT 0.4 07/23/2023 1040   GFRNONAA 95 08/12/2017 1352   GFRAA 109 08/12/2017 1352       Component Value Date/Time   WBC 13.6 (H) 08/12/2017 1352   WBC 10.9 11/29/2016 1636   RBC 4.26 08/12/2017 1352   RBC 4.03 11/29/2016 1636   HGB 14.0 08/12/2017 1352   HCT 41.8 08/12/2017 1352   PLT 347 08/12/2017 1352   MCV 98 (H) 08/12/2017 1352   MCH 32.9 08/12/2017 1352   MCH 33.1 11/29/2016 1636   MCHC 33.5 08/12/2017 1352   MCHC 33.7 11/29/2016 1636   RDW 14.2 08/12/2017 1352   LYMPHSABS 2.8 08/12/2017 1352   MONOABS 0.5 09/10/2013 1403   EOSABS 0.1 08/12/2017 1352   BASOSABS 0.0 08/12/2017 1352    No results found for: POCLITH, LITHIUM   No results found for: PHENYTOIN, PHENOBARB, VALPROATE, CBMZ    .res Assessment: Plan:   Plan:  1. Xanax  0.5mg  daily 2. Xyprexa 5mg  - one tablet daily  RTC 6 months  20 minutes spent dedicated to the care of this patient on the date of this encounter to include pre-visit review of records, ordering of medication, post visit documentation, and face-to-face time with the patient discussing GAD, MDD and BPD 1. Discussed continuing current medication regimen.  Patient advised to contact office with any questions, adverse effects, or acute worsening in signs and symptoms.  Discussed potential metabolic side effects associated with atypical antipsychotics, as well as potential risk for movement side effects. Advised pt to contact office if movement side effects occur.   Discussed potential  benefits, risk, and side effects of benzodiazepines to include potential risk of tolerance and dependence, as well as possible drowsiness. Advised patient not to drive if experiencing drowsiness and to take lowest possible effective dose to minimize risk of dependence and tolerance. There are no diagnoses linked to this encounter.   Please see After Visit Summary for patient specific instructions.  Future Appointments  Date Time Provider Department Center  02/26/2024  3:40 PM Rudd, Garnette HERO, MD [facility]    No orders of the defined types were placed in this encounter.   -------------------------------      [1] Allergies Allergen Reactions   Codeine Nausea And Vomiting   Short Ragweed Pollen Ext    Sulfa Antibiotics Hives, Rash and Itching

## 2024-01-22 MED ORDER — ALPRAZOLAM 0.5 MG PO TABS: ORAL_TABLET | ORAL | 2 refills | Status: AC

## 2024-01-22 MED ORDER — OLANZAPINE 5 MG PO TABS: 5.0000 mg | ORAL_TABLET | Freq: Every day | ORAL | 1 refills | Status: AC

## 2024-02-13 ENCOUNTER — Other Ambulatory Visit: Payer: Self-pay | Admitting: Family Medicine

## 2024-02-13 DIAGNOSIS — E785 Hyperlipidemia, unspecified: Secondary | ICD-10-CM

## 2024-02-13 DIAGNOSIS — I2510 Atherosclerotic heart disease of native coronary artery without angina pectoris: Secondary | ICD-10-CM

## 2024-02-23 ENCOUNTER — Other Ambulatory Visit: Payer: Self-pay | Admitting: Family Medicine

## 2024-02-23 DIAGNOSIS — E785 Hyperlipidemia, unspecified: Secondary | ICD-10-CM

## 2024-02-23 DIAGNOSIS — I2510 Atherosclerotic heart disease of native coronary artery without angina pectoris: Secondary | ICD-10-CM

## 2024-02-26 ENCOUNTER — Encounter: Payer: Self-pay | Admitting: Family Medicine

## 2024-02-26 ENCOUNTER — Ambulatory Visit (INDEPENDENT_AMBULATORY_CARE_PROVIDER_SITE_OTHER): Admitting: Family Medicine

## 2024-02-26 VITALS — BP 118/64 | HR 95 | Temp 98.4°F | Ht 65.5 in | Wt 131.0 lb

## 2024-02-26 DIAGNOSIS — I2584 Coronary atherosclerosis due to calcified coronary lesion: Secondary | ICD-10-CM | POA: Diagnosis not present

## 2024-02-26 DIAGNOSIS — F319 Bipolar disorder, unspecified: Secondary | ICD-10-CM | POA: Diagnosis not present

## 2024-02-26 DIAGNOSIS — D225 Melanocytic nevi of trunk: Secondary | ICD-10-CM | POA: Insufficient documentation

## 2024-02-26 DIAGNOSIS — Z0000 Encounter for general adult medical examination without abnormal findings: Secondary | ICD-10-CM

## 2024-02-26 DIAGNOSIS — E785 Hyperlipidemia, unspecified: Secondary | ICD-10-CM | POA: Diagnosis not present

## 2024-02-26 DIAGNOSIS — J439 Emphysema, unspecified: Secondary | ICD-10-CM

## 2024-02-26 DIAGNOSIS — F17209 Nicotine dependence, unspecified, with unspecified nicotine-induced disorders: Secondary | ICD-10-CM

## 2024-02-26 DIAGNOSIS — I2510 Atherosclerotic heart disease of native coronary artery without angina pectoris: Secondary | ICD-10-CM | POA: Diagnosis not present

## 2024-02-26 MED ORDER — ROSUVASTATIN CALCIUM 40 MG PO TABS: 40.0000 mg | ORAL_TABLET | Freq: Every day | ORAL | 3 refills | Status: AC

## 2024-02-26 NOTE — Progress Notes (Signed)
 " [facility] [address] [place] [address] Dept: [phone] Dept Fax: [phone]  Annual Physical Visit  Subjective:    Patient ID: Kelly Vincent, female    DOB: 1970/03/29, 54 y.o..   MRN: [id number]  Chief Complaint  Patient presents with   Annual Exam    CPE/labs.  Not fasting today.    History of Present Illness:  Patient is in today for an annual physical/preventative visit.  Kelly Vincent has a history of generalized anxiety, major depression, and bipolar 1 disorder. She is managed by Angeline Sayers, NP on olanzapine  5 mg daily, and alprazolam  0.5 mg as needed. She continues to note significant stressors related to her relationship with her son.   Kelly Vincent continues to smoke. She has tried NRT in the past without success. She has a history of CAD noted on CT scan. She is currently managed on rosuvastatin  40 mg daily. She admits to occasional chest tightness. In the past this was determined to not be cardiac in origin. She is not exercising currently, but remains very active on her job. She does exercise by biking int he summers.  Review of Systems  Constitutional:  Negative for chills, diaphoresis, fever, malaise/fatigue and weight loss.  HENT:  Negative for congestion, ear pain, hearing loss, sinus pain, sore throat and tinnitus.   Eyes:  Positive for blurred vision. Negative for pain, discharge and redness.       Notes it is getting more difficult to focus close up. She uses OTC reading glasses.  Respiratory:  Positive for cough. Negative for shortness of breath and wheezing.        Regular morning cough. She notes some of this relates to nasal mucous. She also thinks her nerves play a role.  Cardiovascular:  Negative for chest pain and palpitations.  Gastrointestinal:  Negative for abdominal pain, constipation, diarrhea, heartburn, nausea and vomiting.  Musculoskeletal:  Negative for back pain, joint pain and  myalgias.  Skin:  Negative for itching and rash.       Several benign moles  Psychiatric/Behavioral:  Positive for depression. The patient is nervous/anxious.        Psych issues as noted above.   Past Medical History: Patient Active Problem List   Diagnosis Date Noted   Aortic atherosclerosis 07/29/2023   Nephrolithiasis 09/15/2021   COPD (chronic obstructive pulmonary disease) (HCC) 09/14/2021   Coronary artery disease due to calcified coronary lesion 09/14/2021   Cervical intraepithelial neoplasia grade 1 09/06/2021   Generalized anxiety disorder 05/16/2021   Insomnia 05/16/2021   Bipolar I disorder (HCC) 05/16/2021   History of colon polyps 09/01/2020   Allergic rhinitis 07/26/2020   History of kidney stones 07/26/2020   Borderline hyperlipidemia 07/26/2020   Breast lump 02/19/2019   Genital herpes simplex 08/12/2017   Tobacco use disorder, continuous 08/19/2012   Major depressive disorder, recurrent (HCC) 08/19/2012   Past Surgical History:  Procedure Laterality Date   OVARIAN CYST SURGERY     rupture of cyst   Family History  Adopted: Yes  Problem Relation Age of Onset   Autism Son    Outpatient Medications Prior to Visit  Medication Sig Dispense Refill   albuterol (VENTOLIN HFA) 108 (90 Base) MCG/ACT inhaler Inhale 2 puffs into the lungs every 6 (six) hours as needed. (Patient not taking: Reported on 07/23/2023)     ALPRAZolam  (XANAX ) 0.5 MG tablet TAKE 1 TABLET(0.5 MG) BY MOUTH DAILY AS NEEDED FOR ANXIETY 30 tablet  2   aspirin  EC 81 MG tablet Take 1 tablet (81 mg total) by mouth daily. Swallow whole. 90 tablet 3   cetirizine (ZYRTEC) 10 MG tablet Take 10 mg by mouth as needed.     OLANZapine  (ZYPREXA ) 5 MG tablet Take 1 tablet (5 mg total) by mouth at bedtime. 90 tablet 1   rosuvastatin  (CRESTOR ) 40 MG tablet TAKE 1 TABLET(40 MG) BY MOUTH DAILY 90 tablet 0   No facility-administered medications prior to visit.   Allergies  Allergen Reactions   Codeine Nausea  And Vomiting   Short Ragweed Pollen Ext    Sulfa Antibiotics Hives, Rash and Itching   Objective:   Today's Vitals   02/26/24 1526  BP: 118/64  Pulse: 95  Temp: 98.4 F (36.9 C)  TempSrc: Temporal  SpO2: 96%  Weight: 131 lb (59.4 kg)  Height: 5' 5.5 (1.664 m)   Body mass index is 21.47 kg/m.   General: Well developed, well nourished. No acute distress. HEENT: Normocephalic, non-traumatic. PERRL, EOMI. Conjunctiva clear. External ears normal. EAC and TMs normal bilaterally. Nose    clear without congestion or rhinorrhea. Mucous membranes moist. Oropharynx clear. Partial plate in upper jaw. Teeth in good repair.. Neck: Supple. No lymphadenopathy. No thyromegaly. Lungs: Clear to auscultation bilaterally. No wheezing, rales or rhonchi. CV: RRR without murmurs or rubs. Pulses 2+ bilaterally. Abdomen: Soft, non-tender. Bowel sounds positive, normal pitch and frequency. No hepatosplenomegaly. No rebound or guarding. Extremities: Full ROM. No joint swelling or tenderness. No edema noted. Skin: Warm and dry. 4 mm darkly pigmented mole on left upper chest wall. It has smooth borders and a consistent color. Psych: Alert and oriented. Normal mood and affect.  Health Maintenance Due  Topic Date Due   Pneumococcal Vaccine: 50+ Years (1 of 2 - PCV) Never done   Hepatitis B Vaccines 19-59 Average Risk (1 of 3 - 19+ 3-dose series) Never done   Zoster Vaccines- Shingrix (1 of 2) Never done   Cervical Cancer Screening (HPV/Pap Cotest)  Never done   Mammogram  12/02/2021   DTaP/Tdap/Td (2 - Td or Tdap) 08/20/2022   Influenza Vaccine  09/06/2023   COVID-19 Vaccine (4 - 2025-26 season) 10/07/2023   Lab Results Lipid Panel:  Lab Results  Component Value Date   CHOL 117 07/23/2023   HDL 50 07/23/2023   LDLCALC 51 07/23/2023   TRIG 78 07/23/2023     Imaging: LDCT Lung Cancer Screen (07/12/2023) IMPRESSION: 1. Lung-RADS 1, negative. Continue annual screening with low-dose chest CT without  contrast in 12 months. 2. Three-vessel coronary atherosclerosis. 3. Nonobstructing left nephrolithiasis. 4. Aortic Atherosclerosis (ICD10-I70.0) and Emphysema (ICD10-J43.9).  Assessment & Plan:   Problem List Items Addressed This Visit       Cardiovascular and Mediastinum   Coronary artery disease due to calcified coronary lesion   Continue to recommend smoking cessation and lipid management. She should consider a follow-upw ith Dr. Pietro for ongoing monitoring.      Relevant Medications   rosuvastatin  (CRESTOR ) 40 MG tablet     Respiratory   COPD (chronic obstructive pulmonary disease) (HCC)   Morning cough likely related to COPD. No dyspnea. Continue to urge smoking cessation.        Musculoskeletal and Integument   Melanocytic nevus of trunk   Appears benign. Continue to follow with dermatology.        Other   Annual physical exam - Primary   Overall health is fair. Recommend regular exercise for cardiac benefits. Discussed  recommended screenings and immunizations. She will consider returning for immunizations.       Bipolar I disorder (HCC)   Continue to follow with behavioral health. Continue olanzapine  5 mg daily, and alprazolam  0.5 mg as needed.      Borderline hyperlipidemia   LDL at goal. Continue rosuvastatin  40 mg daily.      Relevant Medications   rosuvastatin  (CRESTOR ) 40 MG tablet   Tobacco use disorder, continuous   Continues to smoke ~1 ppd. I continue to advise smoking cessation efforts. Due for annual LDCT for lung cancer screening in June.       Return in about 5 months (around 07/26/2024) for Reassessment.   Garnette CHRISTELLA Simpler, MD  I,Emily Lagle,acting as a scribe for Garnette CHRISTELLA Simpler, MD.,have documented all relevant documentation on the behalf of Garnette CHRISTELLA Simpler, MD.  I, Garnette CHRISTELLA Simpler, MD, have reviewed all documentation for this visit. The documentation on 02/26/2024 for the exam, diagnosis, procedures, and orders are all accurate and  complete.   "

## 2024-02-26 NOTE — Assessment & Plan Note (Signed)
 Continue to follow with behavioral health. Continue olanzapine 5 mg daily, and alprazolam 0.5 mg as needed.

## 2024-02-26 NOTE — Patient Instructions (Signed)
 Recommend returning for following vaccines: Tdap (tetanus and whooping cough) Zoster (shingles) Pneumococcal (pneumonia)

## 2024-02-26 NOTE — Assessment & Plan Note (Addendum)
 Continue to recommend smoking cessation and lipid management. She should consider a follow-upw ith Dr. Pietro for ongoing monitoring.

## 2024-02-26 NOTE — Assessment & Plan Note (Addendum)
 LDL at goal. Continue rosuvastatin  40 mg daily.

## 2024-02-26 NOTE — Assessment & Plan Note (Signed)
 Overall health is fair. Recommend regular exercise for cardiac benefits. Discussed recommended screenings and immunizations. She will consider returning for immunizations.

## 2024-02-26 NOTE — Assessment & Plan Note (Signed)
 Appears benign. Continue to follow with dermatology.

## 2024-02-26 NOTE — Assessment & Plan Note (Addendum)
 Continues to smoke ~1 ppd. I continue to advise smoking cessation efforts. Due for annual LDCT for lung cancer screening in June.

## 2024-02-26 NOTE — Assessment & Plan Note (Signed)
 Morning cough likely related to COPD. No dyspnea. Continue to urge smoking cessation.

## 2024-02-27 ENCOUNTER — Encounter: Payer: Self-pay | Admitting: Family Medicine

## 2024-07-21 ENCOUNTER — Ambulatory Visit: Admitting: Adult Health

## 2024-07-27 ENCOUNTER — Ambulatory Visit: Admitting: Family Medicine
